# Patient Record
Sex: Female | Born: 1950 | Race: Black or African American | Hispanic: No | Smoking: Current every day smoker
Health system: Southern US, Community
[De-identification: ages and names within clinical notes are randomized; demographics above are authoritative.]

## PROBLEM LIST (undated history)

## (undated) DIAGNOSIS — R42 Dizziness and giddiness: Secondary | ICD-10-CM

## (undated) DIAGNOSIS — R55 Syncope and collapse: Secondary | ICD-10-CM

## (undated) DIAGNOSIS — Z5189 Encounter for other specified aftercare: Secondary | ICD-10-CM

## (undated) DIAGNOSIS — M1990 Unspecified osteoarthritis, unspecified site: Secondary | ICD-10-CM

## (undated) DIAGNOSIS — M419 Scoliosis, unspecified: Secondary | ICD-10-CM

## (undated) DIAGNOSIS — D649 Anemia, unspecified: Secondary | ICD-10-CM

## (undated) HISTORY — DX: Unspecified osteoarthritis, unspecified site: M19.90

## (undated) HISTORY — DX: Scoliosis, unspecified: M41.9

## (undated) HISTORY — DX: Dizziness and giddiness: R42

## (undated) HISTORY — DX: Anemia, unspecified: D64.9

## (undated) HISTORY — PX: ABDOMINAL HYSTERECTOMY: SHX81

## (undated) HISTORY — DX: Encounter for other specified aftercare: Z51.89

## (undated) HISTORY — PX: TONSILLECTOMY: SUR1361

---

## 1997-07-26 ENCOUNTER — Inpatient Hospital Stay (HOSPITAL_COMMUNITY): Admission: AD | Admit: 1997-07-26 | Discharge: 1997-07-28 | Payer: Self-pay | Admitting: Obstetrics & Gynecology

## 1999-12-30 ENCOUNTER — Encounter: Admission: RE | Admit: 1999-12-30 | Discharge: 1999-12-30 | Payer: Self-pay | Admitting: Family Medicine

## 2000-02-01 ENCOUNTER — Encounter: Admission: RE | Admit: 2000-02-01 | Discharge: 2000-02-01 | Payer: Self-pay | Admitting: Family Medicine

## 2000-03-17 ENCOUNTER — Encounter: Admission: RE | Admit: 2000-03-17 | Discharge: 2000-03-17 | Payer: Self-pay | Admitting: Sports Medicine

## 2005-12-07 ENCOUNTER — Other Ambulatory Visit: Admission: RE | Admit: 2005-12-07 | Discharge: 2005-12-07 | Payer: Self-pay | Admitting: *Deleted

## 2005-12-17 ENCOUNTER — Encounter: Admission: RE | Admit: 2005-12-17 | Discharge: 2005-12-17 | Payer: Self-pay | Admitting: *Deleted

## 2006-02-01 ENCOUNTER — Inpatient Hospital Stay (HOSPITAL_COMMUNITY): Admission: RE | Admit: 2006-02-01 | Discharge: 2006-02-05 | Payer: Self-pay | Admitting: Obstetrics and Gynecology

## 2006-02-01 ENCOUNTER — Encounter (INDEPENDENT_AMBULATORY_CARE_PROVIDER_SITE_OTHER): Payer: Self-pay | Admitting: *Deleted

## 2006-04-27 ENCOUNTER — Encounter: Admission: RE | Admit: 2006-04-27 | Discharge: 2006-04-27 | Payer: Self-pay | Admitting: Otolaryngology

## 2007-01-12 ENCOUNTER — Other Ambulatory Visit: Admission: RE | Admit: 2007-01-12 | Discharge: 2007-01-12 | Payer: Self-pay | Admitting: Family Medicine

## 2008-05-30 ENCOUNTER — Inpatient Hospital Stay (HOSPITAL_COMMUNITY): Admission: EM | Admit: 2008-05-30 | Discharge: 2008-06-05 | Payer: Self-pay | Admitting: Emergency Medicine

## 2008-06-28 ENCOUNTER — Emergency Department (HOSPITAL_COMMUNITY): Admission: EM | Admit: 2008-06-28 | Discharge: 2008-06-28 | Payer: Self-pay | Admitting: Family Medicine

## 2010-07-30 LAB — POCT I-STAT, CHEM 8
BUN: 14 mg/dL (ref 6–23)
Chloride: 108 mEq/L (ref 96–112)
Creatinine, Ser: 1.1 mg/dL (ref 0.4–1.2)
HCT: 37 % (ref 36.0–46.0)
Potassium: 3.4 mEq/L — ABNORMAL LOW (ref 3.5–5.1)

## 2010-08-04 LAB — BASIC METABOLIC PANEL
BUN: 1 mg/dL — ABNORMAL LOW (ref 6–23)
CO2: 26 mEq/L (ref 19–32)
CO2: 27 mEq/L (ref 19–32)
CO2: 28 mEq/L (ref 19–32)
Calcium: 8.4 mg/dL (ref 8.4–10.5)
Chloride: 105 mEq/L (ref 96–112)
Chloride: 108 mEq/L (ref 96–112)
GFR calc Af Amer: >60 mL/min (ref >60)
GFR calc non Af Amer: >60 mL/min (ref >60)
GFR calc non Af Amer: >60 mL/min (ref >60)
Glucose, Bld: 121 mg/dL — ABNORMAL HIGH (ref 70–99)
Glucose, Bld: 106 mg/dL — ABNORMAL HIGH (ref 70–99)
Potassium: 3.9 mEq/L (ref 3.5–5.1)
Potassium: 4.6 mEq/L (ref 3.5–5.1)
Sodium: 135 mEq/L (ref 135–145)
Sodium: 138 mEq/L (ref 135–145)

## 2010-08-04 LAB — RAPID URINE DRUG SCREEN, HOSP PERFORMED
Amphetamines: NOT DETECTED
Barbiturates: NOT DETECTED
Benzodiazepines: NOT DETECTED

## 2010-08-04 LAB — CBC
HCT: 28.3 % — ABNORMAL LOW (ref 36.0–46.0)
Hemoglobin: 10 g/dL — ABNORMAL LOW (ref 12.0–15.0)
Hemoglobin: 12.4 g/dL (ref 12.0–15.0)
Hemoglobin: 9 g/dL — ABNORMAL LOW (ref 12.0–15.0)
MCHC: 32 g/dL (ref 30.0–36.0)
MCV: 70.6 fL — ABNORMAL LOW (ref 78.0–100.0)
Platelets: 398 10*3/uL (ref 150–400)
RBC: 4.04 MIL/uL (ref 3.87–5.11)
RBC: 4.48 MIL/uL (ref 3.87–5.11)
RDW: 19.1 % — ABNORMAL HIGH (ref 11.5–15.5)
RDW: 19.8 % — ABNORMAL HIGH (ref 11.5–15.5)
RDW: 20 % — ABNORMAL HIGH (ref 11.5–15.5)
WBC: 11.4 10*3/uL — ABNORMAL HIGH (ref 4.0–10.5)

## 2010-08-04 LAB — COMPREHENSIVE METABOLIC PANEL
AST: 11 U/L (ref 0–37)
AST: 17 U/L (ref 0–37)
Albumin: 4.5 g/dL (ref 3.5–5.2)
CO2: 22 mEq/L (ref 19–32)
Calcium: 8.7 mg/dL (ref 8.4–10.5)
Chloride: 109 mEq/L (ref 96–112)
Creatinine, Ser: 0.78 mg/dL (ref 0.4–1.2)
GFR calc Af Amer: >60 mL/min (ref >60)
GFR calc non Af Amer: >60 mL/min (ref >60)
GFR calc non Af Amer: >60 mL/min (ref >60)
Potassium: 3.7 mEq/L (ref 3.5–5.1)
Total Bilirubin: 0.9 mg/dL (ref 0.3–1.2)
Total Bilirubin: 0.9 mg/dL (ref 0.3–1.2)

## 2010-08-04 LAB — URINALYSIS, ROUTINE W REFLEX MICROSCOPIC
Hgb urine dipstick: NEGATIVE
Protein, ur: NEGATIVE mg/dL
Specific Gravity, Urine: 1.021 (ref 1.005–1.030)
Urobilinogen, UA: 1 mg/dL (ref 0.0–1.0)

## 2010-08-04 LAB — HEMOGLOBIN A1C: Mean Plasma Glucose: 131 mg/dL

## 2010-08-04 LAB — DIFFERENTIAL
Basophils Absolute: 0 10*3/uL (ref 0.0–0.1)
Eosinophils Absolute: 0 10*3/uL (ref 0.0–0.7)
Eosinophils Relative: 0 % (ref 0–5)
Lymphocytes Relative: 18 % (ref 12–46)
Monocytes Absolute: 0.6 10*3/uL (ref 0.1–1.0)
Monocytes Relative: 1 % — ABNORMAL LOW (ref 3–12)
Monocytes Relative: 5 % (ref 3–12)
Neutro Abs: 8.6 10*3/uL — ABNORMAL HIGH (ref 1.7–7.7)
Neutrophils Relative %: 77 % (ref 43–77)

## 2010-08-04 LAB — POCT PREGNANCY, URINE: Preg Test, Ur: NEGATIVE

## 2010-08-04 LAB — LIPID PANEL
HDL: 50 mg/dL (ref >39)
LDL Cholesterol: 85 mg/dL (ref 0–99)
Total CHOL/HDL Ratio: 3 RATIO
Triglycerides: 65 mg/dL (ref <150)

## 2010-08-04 LAB — LACTIC ACID, PLASMA: Lactic Acid, Venous: 0.6 mmol/L (ref 0.5–2.2)

## 2010-08-04 LAB — LIPASE, BLOOD: Lipase: 17 U/L (ref 11–59)

## 2010-09-01 NOTE — Discharge Summary (Signed)
NAME:  Makayla Duran, Makayla Duran NO.:  [id number]   MEDICAL RECORD NO.:  [id number]          PATIENT TYPE:  INP   LOCATION:  1301                         FACILITY:  [facility]   PHYSICIAN:  Charlestine Massed, MDDATE OF BIRTH:  08/07/1950   DATE OF ADMISSION:  05/30/2008  DATE OF DISCHARGE:                               DISCHARGE SUMMARY   PRIMARY CARE DOCTOR:  She does not have a primary care doctor so far,  and she is planning to set it up after she gets discharged because of  recent health plan changes.   SURGEON:  Dr. Ezzard Standing of [facility].   REASON FOR ADMISSION:  Abdominal pain and vomiting.   HOSPITAL COURSE:  A 60 year old female, [name], who has a  prior history of hysterectomy and bilateral salpingo-oophorectomy in  2007, came to the emergency room with complaint of 1-day history of  abdominal pain and vomiting.  Abdominal pain was very severe, woke her  up from sleep, after which she had intractable vomiting for more than 12  hours.  She did not have any other complaints of chest pain or loss of  consciousness.  She was feeling weak and fatigued.  No urinary  symptoms,no fever, no chills.  She never had similar symptoms before.   The patient was admitted and evaluated.  CT scan of abdomen with  contrast showed evidence of a small-bowel obstruction, so the patient  was treated, admitted for small-bowel obstruction.  There were dilated  loops of small bowel in the left lower quadrant.   Dr. Ezzard Standing from [facility] saw the patient and evaluated  her and found there is no reason for any surgical intervention.  The  patient was observed.  With IV fluids and with antiemetic and pain  medications, the patient improved considerably.  Her electrolytes were  repleted.   After the patient's improvement, the patient was slowly started on clear  liquid diet and repeat KUB showed just gaseous distention, no evidence  of fluid,  air-fluid levels.  Today the patient is being advanced to a  regular diet and if the patient tolerates it for today will be  discharged tomorrow in a.m.   DISCHARGE DIAGNOSES:  1. Small-bowel obstruction.  2. Tobacco dependence.   DISCHARGE MEDICATIONS:  1. Nicotine patch 21 mg per day for 7 days, then 14 mg per day for 7      days and then 7 mg per day for 7 days, then stop.  2. Zofran 4 mg p.o. to give 4 times a day p.r.n. for nausea, vomiting.   DISCHARGE INSTRUCTIONS:  The patient has been advised to continue taking  regular diet.  If there is any recurrence of pains, to come back to the  emergency room.   FOLLOWUP:  The patient has been advised set up a primary care doctor for  regular followup and to follow up with Dr. Ezzard Standing if needed, which will  be decided tomorrow.   A total of 50 minutes was spent on this discharge.  The patient will be  discharged possibly tomorrow if she is stable.  Charlestine Massed, MD  Electronically Signed     UT/MEDQ  D:  06/04/2008  T:  06/04/2008  Job:  [id number]   cc:   Sandria Bales. Ezzard Standing, M.D.  [address]  [place]  [place] [address]

## 2010-09-01 NOTE — Discharge Summary (Signed)
NAME:  Makayla Duran, Makayla Duran             ACCOUNT NO.:  [id number]   MEDICAL RECORD NO.:  [id number]          PATIENT TYPE:  INP   LOCATION:  1301                         FACILITY:  [facility]   PHYSICIAN:  Monte Fantasia, MD  DATE OF BIRTH:  02/17/1951   DATE OF ADMISSION:  05/30/2008  DATE OF DISCHARGE:                               DISCHARGE SUMMARY   ADDENDUM:  Please refer to the discharge summary dictated by Dr. Berkley Harvey on June 05, 2007.  There have been no interim changes in the discharge diagnoses  or the discharge medications as dictated in the prior discharge summary.  For the last 24 hours the patient has tolerated oral intake very well  and denies any complaints of pain in abdomen.  The patient also was  followed up with Dr. Ezzard Standing who recommended that the patient is stable  to be discharged and can be discharged home today.   The patient's CT scan of the chest was reviewed regarding the pulmonary  lung nodule with her and the patient is recommended to follow up with  the primary care physician in one week and to follow up with the CT scan  report and recommended to have a CT scan repeat done in 3 months.   The patient at present is medically stable to be discharged and can be  discharged home.  Recommended to follow up with the primary care  physician in one week.      Monte Fantasia, MD  Electronically Signed     MP/MEDQ  D:  06/05/2008  T:  06/05/2008  Job:  [id number]

## 2010-09-01 NOTE — H&P (Signed)
NAME:  Makayla Duran, Makayla Duran NO.:  [id number]   MEDICAL RECORD NO.:  [id number]          PATIENT TYPE:  EMS   LOCATION:  ED                           FACILITY:  [facility]   PHYSICIAN:  Oswald Hillock, MD        DATE OF BIRTH:  1950-05-10   DATE OF ADMISSION:  05/30/2008  DATE OF DISCHARGE:                              HISTORY & PHYSICAL   CHIEF COMPLAINT:  Abdominal pain/vomiting.   HISTORY OF PRESENT ILLNESS:  The patient is a 60 year old Caucasian  female with history of hysterectomy and bilateral salpingo-oophorectomy  in 2007, who presents to the emergency room with a 1 day history of  abdominal pain and vomiting.  The patient states that she was doing well  till last night and this morning she woke up with significant abdominal  pain and was vomiting incessantly to 12:30 p.m. this afternoon.  She  denies any chest pain, any shortness of breath, palpitations, dizziness,  loss of consciousness or any focal weakness of any part of the body.  No  fever, rigors, chills reported by the patient either.  The patient has  never had similar symptoms in the past and denies any urinary symptoms.   PAST MEDICAL HISTORY:  1. Significant uterine leiomyoma  2. Postmenopausal bleeding.  3. Hydrosalpinx.   PAST SURGICAL HISTORY:  Significant for abdominal hysterectomy and  bilateral salpingo-oophorectomy.   CURRENT MEDICATIONS:  None.   ALLERGIES:  NO KNOWN DRUG ALLERGIES.   FAMILY HISTORY:  No history of hypertension, diabetes mellitus in the  family.   SOCIAL HISTORY:  The patient smokes a pack per day.  Does use crack  cocaine on a regular basis.  Denies any alcohol use.   REVIEW OF SYSTEMS:  Extensive review of systems were done and all  systems are negative except for positives mentioned in the history of  present illness.   PHYSICAL EXAMINATION:  VITAL SIGNS on admission:  Pulse 63, blood  pressure 140/66, respiratory rate of 20, temperature 98.3.  GENERAL:  The patient  is conscious, alert, oriented to time, place and  person in  mild-moderate distress complaining of significant abdominal discomfort.  HEENT:  No scleral icterus.  No pallor.  Ears negative.  Poor dental  hygiene.  NECK:  Supple.  No lymphadenopathy.  No JVD.  No carotid bruits.  CHEST:  Breath sounds heard bilaterally, bilateral rhonchi.  CVS:  S1 and S2 plus regular.  No gallop, rub or murmur appreciated.  ABDOMEN:  Soft.  Diffuse tenderness.  However, no guarding or rebound.  Bowel sounds are present in all 4 quadrants.  EXTREMITIES:  No cyanosis, clubbing or edema.  NEUROLOGIC:  Cranial nerves II-XII appear grossly intact.  The patient  moves all 4 extremities.   DIAGNOSTICS:  CT scan of the abdomen revealed small bowel obstruction,  some mesenteric edema and ascites, but no focal extraluminal fluid  collection and 6 mm left lower lobe pulmonary nodule.   LABORATORY DATA:  WBC count was 11.4, hemoglobin 12.4, hematocrit 38.9,  platelet count 398, lipase was 17.  CMP showed sodium of 136, potassium  3.6, chloride 104, CO2 22, glucose 132, BUN 7, creatinine 0.78, calcium  of 9.8.  Urine drug screen was positive for cocaine.  Urinalysis was  negative and urine pregnancy test was negative as well.   IMPRESSION/PLAN:  This is a case of a 60 year old African American  female who presents with a 1 day history of abdominal pain and vomiting.  1. Small bowel obstruction.  We will admit the patient to the Medicine      Service, put an NG tube to low volume suction, use Zofran and      Phenergan on a p.r.n. basis for vomiting.  We will hydrate the      patient with intravenous fluids normal saline at 150 mL an hour.      Surgery has been consulted by the ER physician and they will assess      the patient in the morning.  Etiology of this is unclear.  The      patient has only had a single abdominal hysterectomy and there are      no reports of any adhesions on the CT scan.  We will get a  lactic      acid level and follow up with the results.  2. Left lung nodule.  Given her significant history of smoking, the      suspicion for malignancy is high.  She may need either outpatient      evaluation or if she stays inpatient for a few days, we may consult      Pulmonary Medicine and follow up with their recommendations.  3. Cocaine abuse.  The patient was counseled about the need for      cessation and should follow up with her primary care physician.  4. DVT/GI prophylaxis.  Heparin and Protonix.      Oswald Hillock, MD  Electronically Signed     BA/MEDQ  D:  05/30/2008  T:  05/31/2008  Job:  [id number]   cc:   PCP

## 2010-09-01 NOTE — Consult Note (Signed)
NAME:  Makayla Duran, VARANO NO.:  [id number]   MEDICAL RECORD NO.:  [id number]          PATIENT TYPE:  INP   LOCATION:  1301                         FACILITY:  [facility]   PHYSICIAN:  Sandria Bales. Ezzard Standing, M.D.  DATE OF BIRTH:  [birth year]   DATE OF CONSULTATION:  05/31/2008  DATE OF DISCHARGE:                                 CONSULTATION   Date of surgery ?   REFERRING PHYSICIAN:  Monte Fantasia, M.D.   REASON FOR CONSULTATION:  Bowel obstruction.   HISTORY OF PRESENT ILLNESS:  This is a 60 year old black female who was  seeing Dr. Rosey Bath of [place] at [facility] (as I understand it,  Dr. Vedia Coffer has left town) and Ms. Laramee is looking for a new primary  care physician.  She awoke yesterday morning about 3 a.m. with severe  abdominal pain which was not well localized.  She had nausea and  vomiting.  She tried to have a couple of bowel movements which were  painful.  She presented to the [facility] Emergency Room where she  underwent a CT scan which showed evidence of a bowel obstruction and  [facility] was consulted.  She was admitted by [facility] and Dr.  Kathryne Hitch asked me to see her today for further evaluation.   Ms. Mccallum has had no prior gastrointestinal problems.  She has had no  peptic ulcer disease, liver disease, gallbladder disease, pancreatic  disease or colon disease.  She has had 3 gynecologic procedures, the  first about 1980, she had some fibroids removed from her uterus in  [place], then in 1999 she had a right ovary removed for an infection by  Dr. Donzetta Sprung, and then February 01, 2006 she had a hysterectomy and left  salpingo-oophorectomy by Dr. Loreli Dollar for hydrosalpinx and  postmenopausal bleeding.   She has had no prior complaints of either bowel obstruction or change in  bowel habits, and she noticed no change in her stools prior to this  event.   PAST MEDICAL HISTORY:  She has no allergies.  She is on no medications.   REVIEW OF SYSTEMS:  NEUROLOGIC:  She has had no seizure or loss of  consciousness.  PULMONARY:  She does smoke about a half a pack of cigarettes per day,  she knows this is bad for her health.  CARDIAC:  She has had no heart disease, chest pain or hypertension, and  she has never had a prior cardiac catheterization.  GASTROINTESTINAL:  See history of present illness.  UROLOGIC:  Ms. Ressler has had no kidney infections.   SOCIAL HISTORY:  She is separated, she lives by herself, she has a  brother who lives in town, a sister who lives in [place].  She has never  had any children.  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   PHYSICAL EXAM:  She is a thinner black female who is alert and  cooperative though miserable with the NG tube.  Her temperature is 98.6, her pulse is 66, her respirations are 18, blood  pressure  133/80.   HEENT:  Normocephalic, no oral lesions.  She has an NGT in her right  nares which is very uncomfortable.  NECK:  Normal thyroid without mass  LYMPH NODES:  No cervical or supraclavicular nodes  LUNGS:  Clear to auscultation and symmetric  HEART:  Regular rate and rythm, no murmur  ABDOMEN:  Soft, has bowel sounds, has well healed Pfannenstiel incision,  no hernia or mass  EXTREMITIES:  Good strength in upper and lower extremities  NEUROLOGIC:  Intact to motor and sensory exam   Her hemoglobin is 10, hematocrit 31, white blood count of 11,200.  She  has 77% neutrophils, her sodium is 140, potassium 4.1, chloride of 109,  CO2 of 24, glucose of 110.  Her creatinine is 0.79.  Her liver functions  are within normal limits.  Her lipid panel showed a cholesterol of 148  and she had a lipase on admission which was normal and a lactic acid  which was also normal.   I reviewed her CT scan which does show an apparent high-grade  obstruction at the mid small bowel.  She also has a small nodule on her  left lower  lobe in which there was a recommended 48-month followup with  low clinical suspicion or a PET scan with high clinical suspicion.  Since she is a smoker, she ought to consider a PET scan, and I discussed  this with her.   IMPRESSION:  1. Small bowel obstruction, probably secondary to adhesions:  I agree      with plan for NG tube and IV fluids.  Will plan KUB.  Hopefully      this will resolve without surgery, but I told her if she was      clearly no better in 2-3 days, she  could require either      laparoscopic or open laparotomy for evaluation of this bowel      obstruction.  The patient understands this pretty well.  She is      having a lot of trouble tolerating the NGT.  We will try throat      sprays, lozanges and ice to try to help her discomfort.  2. Smokes cigarettes.  3. Abnormal computed tomographic scan nodule of left chest:  This will      need to be worked up once this bowel obstruction has cleared up.      Discussed with patient.      Sandria Bales. Ezzard Standing, M.D.  Electronically Signed     DHN/MEDQ  D:  05/31/2008  T:  05/31/2008  Job:  [id number]   cc:   Monte Fantasia, MD   Bing Neighbors. Sydnee Cabal, MD  Fax: [phone]   Carma Leaven, DO  Fax: [phone]

## 2010-09-04 NOTE — Discharge Summary (Signed)
NAME:  Makayla Duran, Makayla Duran             ACCOUNT NO.:  [id number]   MEDICAL RECORD NO.:  [id number]          PATIENT TYPE:  INP   LOCATION:  9316                          FACILITY:  [facility]   PHYSICIAN:  Charles A. Delcambre, MDDATE OF BIRTH:  May 27, 1950   DATE OF ADMISSION:  02/01/2006  DATE OF DISCHARGE:  02/05/2006                                 DISCHARGE SUMMARY   PRIMARY DISCHARGE DIAGNOSES:  1. CIN-2.  2. Hydrosalpinx.  3. Uterine leiomyoma.  4. Post menopausal bleeding.   PROCEDURE:  Transabdominal hysterectomy, left salpingo-oophorectomy  (previous right salpingo-oophorectomy).   DISPOSITION:  The patient discharged home.  The patient to return to the  office in two weeks.  She was given convalescent instructions to rest at  home. No heavy lifting greater than 25 pounds for two weeks. No bath for two  weeks.  Shower okay for two weeks. No driving for two weeks.  Call for any  temperature over 100 degrees, any erythema or drainage from the wound.  Any  significant vaginal bleeding, increased pain, nausea or vomiting of  significance.  She was given prescription of Vicodin 1-2 p.o. q. 4 hours #40  and Motrin 600 mg one p.o. q. 6 hours p.r.n. #30, two refills. She began  taking iron in about 1-2 weeks as tolerated. She has a prescription of iron  tablet, I do not recall which one from the office, and will take this once a  day.   History and physical is dictated on the chart.   LABORATORY DATA:  Postoperative hematocrit 27.4, hemoglobin 8.9.  Pathology  did return with diagnosis as noted above from the uterus and left ovary and  tube.   HOSPITAL COURSE:  The patient was admitted and underwent surgery as noted  above.  Postoperatively, the patient had routine postop course on postop day  one with good pain control with PCA.  The PCA was discontinued. She was  placed on Percocet and she did have some nausea on evening of postop day one  and vomited some on the evening of postop  day one. She voided without  difficulty on postop day one once the catheter was discontinued. She had  hypoactive bowel sounds on postop day two and was mildly distended with gas,  consistent with gas, otherwise was stable. She elected to get a suppository  and Dulcolax was given.  She had good response with bowel movement and  passage of gas.   On postop day three, she continued to have some nausea and vomiting, this  was questioned whether it was due to the pain medication, Percocet.  She was  observed further through the day and with continued gas pain and some  nausea. She was observed for one more night in the hospital. On postop day  four, she had good flatus, good continued bowel  movements. Nausea had abaded but still some with Percocet. It was elected to  change to Vicodin. She was observed with Vicodin and tolerated this well and  was discharged home with follow-up as noted above.  Staples were  discontinued prior to discharge as she was  on postop day four and Steri-  Strips were applied.      Charles A. Sydnee Cabal, MD  Electronically Signed     CAD/MEDQ  D:  02/05/2006  T:  02/06/2006  Job:  [id number]

## 2010-09-04 NOTE — Op Note (Signed)
NAME:  Makayla Duran, Makayla Duran             ACCOUNT NO.:  [id number]   MEDICAL RECORD NO.:  [id number]          PATIENT TYPE:  AMB   LOCATION:  SDC                           FACILITY:  [facility]   PHYSICIAN:  Charles A. Delcambre, MDDATE OF BIRTH:  [birth year]   DATE OF PROCEDURE:  02/01/2006  DATE OF DISCHARGE:                                 OPERATIVE REPORT   PREOPERATIVE DIAGNOSIS:  1. Left adnexal probable hydrosalpinx, left adnexal mass.  2. Post menopausal bleeding.  3. Cervical stenosis.  4. Left ovarian cyst.   POSTOPERATIVE DIAGNOSIS:  1. Left adnexal probable hydrosalpinx, left adnexal mass.  2. Post menopausal bleeding.  3. Cervical stenosis.  4. Left ovarian cyst.   PROCEDURE:  Transabdominal hysterectomy, left salpingo-oophorectomy.   SURGEON:  Charles A. Sydnee Cabal, MD   ASSISTANT:  Gerald Leitz, M.D.   COMPLICATIONS:  None.   ESTIMATED BLOOD LOSS:  Less than 50 mL.   SPECIMENS:  Uterus, left tube and ovary to pathology.   ANESTHESIA:  General by the endotracheal route.   COUNTS:  Instrument, sponge, and needle counts were correct x 2.   OPERATIVE FINDINGS:  The left tube was restrictive consistent with likely  hydrosalpinx with questionable thrombosis, discolored area, normal uterus  externally grossly, right ovary and tube surgically absent.   DESCRIPTION OF PROCEDURE:  The patient was taken to the operating room and  placed in the supine position.  General endotracheal anesthesia was induced  without difficulty.  She was then sterilely prepped and draped and a  Pfannenstiel incision was made with the knife and carried down to the  fascia.  The fascia was incised with the knife and Mayo scissors.  The  rectus sheath was released superiorly and inferiorly sharply.  The rectus  muscles were sharply dissected in the midline.  The peritoneum was entered  with Metzenbaum scissors without damage to bowel, bladder, or vascular  structures.  The Balfour retractor was placed  and moistened laps x2 were  used to pack the bowel out of the pelvis.  Kelly clamps were placed on the  uterine cornua regions.  The round ligament was opened on either side  opening the broad ligament on the left and exposing a uterine vessel on the  right.  The vesicoperitoneum was taken down anteriorly along the lower  uterine segment.  The ureter was well seen on the left and separated from  the infundibulopelvic pedicle.  This pedicle was taken, free tied, and  transfixion stitched with 0 Vicryl.  Hemostasis was excellent.  The bladder  flap was taken down with sharp dissection with the Metzenbaum scissors and  this allowed skeletonization of the uterine vessels on either side.  These  were simple stitched.  The pedicles were taken on either side.  The  remainder of the cardinal ligaments and, finally, uterosacral ligaments were  taken on either side, transfixion stitched, and held.  This allowed this  pedicle to enter the vaginal cornua at the vaginal angles and Satinsky  scissors were used to excise the cervix from the vaginal cuff.  Richardson  angled sutures of 0 Vicryl were  placed on either vaginal angle.  0 Vicryl  was used in a running locking suture to close the vaginal cuff.  Hemostasis  was excellent, irrigation was carried out, hemostasis of all pedicles and  the vaginal cuff and the bladder were excellent and all instruments were  removed, laps were removed, Balfour retractor was removed.  Subfascial  hemostasis was excellent.  The fascia was closed with #1 Vicryl running  nonlocking suture.  Subcutaneous hemostasis was excellent.  Irrigation was  carried out.  The skin was closed with sterile skin staples.  The patient  tolerated the procedure well and was taken to the recovery room with the  physician in attendance.      Charles A. Sydnee Cabal, MD  Electronically Signed     CAD/MEDQ  D:  02/01/2006  T:  02/02/2006  Job:  [id number]

## 2010-09-04 NOTE — H&P (Signed)
NAME:  Makayla, Duran             ACCOUNT NO.:  [id number]   MEDICAL RECORD NO.:  [id number]          PATIENT TYPE:  AMB   LOCATION:                                FACILITY:  [facility]   PHYSICIAN:  Charles A. Delcambre, MDDATE OF BIRTH:  10-04-1950   DATE OF ADMISSION:  DATE OF DISCHARGE:                                HISTORY & PHYSICAL   A 60 year old, para 0, 0, 1, 0, to be admitted on February 01, 2006, to  undergo transabdominal hysterectomy, left salpingo-oophorectomy for a left  adnexal mass and postmenopausal versus perimenopause bleeding, unresponsive  to conservative management with hormonal management, failing and cervical  stenosis precluding endometrial biopsy.  She had been amenorrheic for 11  months without hot flashes but then developed abnormal uterine bleeding and,  of recently, has complained of abnormal bleeding despite being felt to be  menopausal.  She has had to use 2 pads per day but then recently had to  change a pad every 1 to 2 hours with severe cramping, only controlled with  Motrin 800 mg every 6 hours.  She had an ultrasound which showed a uterine  fibroid posteriorly 1.7 x 1.6 x 1.1 cm, endometrium tri-layered without mass  measuring 0.4 cm, not suspicious.  For this reason, she will not be taken  for D and C with cervical stenosis precluding endometrial biopsy prior to  definitive surgery.  Right ovary surgically absent status post RSO for  benign reasons in the past.  Left ovary does show 4.1 x 2.3 x 3.2 cm simple  cyst, in addition 2.2 x 1.9 x 1.6 cm cyst appearing avascular.  There is a  left adnexal cystic tubular structure 6.8 x 4.8 x 2.3 cm, questionably a  hydrosalpinx.  The patient is very anxious and wishes to undergo definitive  procedure with a hysterectomy and left oophorectomy and a resection of the  left adnexal process.  Secondary to the bleeding and adnexal abnormality,  refusing at this time hormonal management of abnormal bleeding.   PAST  MEDICAL HISTORY:  1. Depression.  2. Insomnia.   SURGICAL HISTORY:  Ovarian cyst resection, myomectomy, tonsils and adenoids,  elective abortion.   MEDICATIONS:  1. Effexor 75 mg, weaning 1 every other day.  2. Excedrin p.r.n.  3. Tylenol p.r.n.  4. Motrin p.r.n.  5. Zolpidem 10 mg p.r.n.  6. Iron sulfate b.i.d. (hematocrit 36, recently with Dr. Vedia Coffer at [facility]).   ALLERGIES:  Adverse reaction to STEROIDS,  non specified.   SOCIAL HISTORY:  Half pack per day cigarette smoking and 1 beer per day.  No  drug use or STD exposure in the past.  She is married in a monogamous  relationship with her husband.   FAMILY HISTORY:  Father deceased at 50, hypertension, heart disease.  Mother  deceased 55, bone cancer, hypertension,  heart disease.  Brother and sister  23 and 73, respectively, in good health.  Denies family history of breast,  uterus, ovaries, cervix, colon cancer, lipoma, strokes, and diabetes.   REVIEW OF SYMPTOMS:  Denies fever, chills, rashes,  lesions, headaches,  dizziness.  She does have some seasonal allergies.  No chest pain, shortness  of breath, or wheezing.  No diarrhea, constipation, bleeding, melena, or  hematochezia.  No urgency, frequency, dysuria, incontinence, hematuria,  galactorrhea, or emotional changes.   PHYSICAL EXAMINATION:  Alert and oriented x3.  No distress.  Blood pressure 118/80.  Respirations 18.  Heart rate 64.  Weight 150 pounds.  HEENT:  Grossly within normal limits.  NECK:  Supple without thyromegaly or adenopathy.  LUNGS: Clear bilaterally.  HEART:  Regular rate and rhythm without murmur, rub, or gallop.  BREAST:  Symmetrical otherwise deferred.  ABDOMEN:  Soft, flat, and nontender without hepatosplenomegaly or abdominal  masses noted.  Pfannenstiel's incision scar is noted.  PELVIC:  Normal external female genitalia.  Bartholin's, urethra, Skene's  within normal limits.  Vault without discharge or lesions.  No cervical   motion tenderness is present.  Bimanual examination, uterus non-enlarged.  Adnexa on the left palpates to be normal in contradistinction to ovary  findings on ultrasound.  I cannot detect a mass in this area.  RECTAL:  Not done.  Anus and perineal body appear normal.   ASSESSMENT:  1. Abnormal uterine bleeding, menopausal versus perimenopausal.  2. Left ovarian cyst.  3. 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  4. Cervical stenosis.   PLAN:  The patient will be admitted to undergo transabdominal hysterectomy,  left salpingo-oophorectomy.  She will remain NPO past midnight the evening  prior to surgery, scheduled for February 01, 2006.  Preoperative CBC will be  drawn  preoperatively with a BM ET and type and screen.  SCDs will be used  perioperatively.  As it is felt to be a benign process, we will not have the  patient undergo a bowel prep.  She gives an informed consent Accepts risks  of infection, bleeding, bowel and bladder damage, ureteral damage, blood  product risk including hepatitis, and HIV exposure.  All questions were  answered and we will proceed as outlined.      Charles A. Sydnee Cabal, MD  Electronically Signed     CAD/MEDQ  D:  01/17/2006  T:  01/17/2006  Job:  [id number]

## 2011-09-14 ENCOUNTER — Emergency Department (HOSPITAL_COMMUNITY)
Admission: EM | Admit: 2011-09-14 | Discharge: 2011-09-14 | Disposition: A | Discharge status: Home / Self Care | Payer: Self-pay | Attending: Emergency Medicine | Admitting: Emergency Medicine

## 2011-09-14 ENCOUNTER — Encounter (HOSPITAL_COMMUNITY): Payer: Self-pay

## 2011-09-14 DIAGNOSIS — F17200 Nicotine dependence, unspecified, uncomplicated: Secondary | ICD-10-CM | POA: Insufficient documentation

## 2011-09-14 DIAGNOSIS — R197 Diarrhea, unspecified: Secondary | ICD-10-CM | POA: Insufficient documentation

## 2011-09-14 DIAGNOSIS — R42 Dizziness and giddiness: Secondary | ICD-10-CM | POA: Insufficient documentation

## 2011-09-14 DIAGNOSIS — R61 Generalized hyperhidrosis: Secondary | ICD-10-CM | POA: Insufficient documentation

## 2011-09-14 DIAGNOSIS — R112 Nausea with vomiting, unspecified: Secondary | ICD-10-CM | POA: Insufficient documentation

## 2011-09-14 LAB — DIFFERENTIAL
Basophils Absolute: 0 10*3/uL (ref 0.0–0.1)
Basophils Relative: 0 % (ref 0–1)
Eosinophils Absolute: 0 10*3/uL (ref 0.0–0.7)
Eosinophils Relative: 0 % (ref 0–5)
Monocytes Absolute: 0.4 10*3/uL (ref 0.1–1.0)
Monocytes Relative: 3 % (ref 3–12)

## 2011-09-14 LAB — URINALYSIS, ROUTINE W REFLEX MICROSCOPIC
Glucose, UA: NEGATIVE mg/dL
Ketones, ur: NEGATIVE mg/dL
Leukocytes, UA: NEGATIVE
Nitrite: NEGATIVE
Protein, ur: NEGATIVE mg/dL
pH: 6 (ref 5.0–8.0)

## 2011-09-14 LAB — COMPREHENSIVE METABOLIC PANEL
AST: 38 U/L — ABNORMAL HIGH (ref 0–37)
Albumin: 3.2 g/dL — ABNORMAL LOW (ref 3.5–5.2)
BUN: 9 mg/dL (ref 6–23)
Calcium: 8.6 mg/dL (ref 8.4–10.5)
Creatinine, Ser: 0.64 mg/dL (ref 0.50–1.10)

## 2011-09-14 LAB — CBC
HCT: 33.5 % — ABNORMAL LOW (ref 36.0–46.0)
MCH: 22.8 pg — ABNORMAL LOW (ref 26.0–34.0)
MCHC: 32.2 g/dL (ref 30.0–36.0)
MCV: 70.7 fL — ABNORMAL LOW (ref 78.0–100.0)
RDW: 20.5 % — ABNORMAL HIGH (ref 11.5–15.5)

## 2011-09-14 LAB — LIPASE, BLOOD: Lipase: 18 U/L (ref 11–59)

## 2011-09-14 MED ORDER — DIPHENOXYLATE-ATROPINE 2.5-0.025 MG PO TABS: 1.0000 | ORAL_TABLET | Freq: Four times a day (QID) | ORAL | Status: AC | PRN

## 2011-09-14 MED ORDER — ONDANSETRON HCL 4 MG/2ML IJ SOLN
INTRAMUSCULAR | Status: AC
  Administered 2011-09-14: 16:00:00
  Filled 2011-09-14: qty 2

## 2011-09-14 MED ORDER — PROMETHAZINE HCL 25 MG PO TABS: 25.0000 mg | ORAL_TABLET | Freq: Four times a day (QID) | ORAL | Status: DC | PRN

## 2011-09-14 MED ORDER — LOPERAMIDE HCL 2 MG PO CAPS
2.0000 mg | ORAL_CAPSULE | ORAL | Status: DC | PRN
  Filled 2011-09-14: qty 1

## 2011-09-14 MED ORDER — ONDANSETRON HCL 4 MG/2ML IJ SOLN
4.0000 mg | Freq: Once | INTRAMUSCULAR | Status: DC
  Filled 2011-09-14: qty 2

## 2011-09-14 MED ORDER — SODIUM CHLORIDE 0.9 % IV BOLUS (SEPSIS)
1000.0000 mL | Freq: Once | INTRAVENOUS | Status: AC
  Administered 2011-09-14: 1000 mL via INTRAVENOUS

## 2011-09-14 MED ORDER — SODIUM CHLORIDE 0.9 % IV SOLN
Freq: Once | INTRAVENOUS | Status: AC
  Administered 2011-09-14: 125 mL via INTRAVENOUS

## 2011-09-14 NOTE — ED Notes (Addendum)
Pt called ems for N/V/D and dizziness, sudden onset at 1400 today, ate lunch at 1200

## 2011-09-14 NOTE — ED Provider Notes (Signed)
History     CSN: [id number]  Arrival date & time 09/14/11  1529   First MD Initiated Contact with Patient 09/14/11 1633      Chief Complaint  Patient presents with  . Nausea  . Emesis  . Diarrhea  . Dizziness    (Consider location/radiation/quality/duration/timing/severity/associated sxs/prior treatment) Patient is a 61 y.o. female presenting with vomiting and diarrhea. The history is provided by the patient.  Emesis  Associated symptoms include diarrhea.  Diarrhea The primary symptoms include vomiting and diarrhea.   patient with acute onset of nausea vomiting diarrhea approximately 2 hours after eating lunch. Some abdominal cramping. Vomiting has been nonbilious. Diarrhea has been watery. No fever reported however she does note some diaphoresis. Denies any chest pain or shortness of breath. No medications taken prior to arrival. Nothing makes her symptoms better or worse  History reviewed. No pertinent past medical history.  Past Surgical History  Procedure Date  . Abdominal hysterectomy   . Tonsillectomy     No family history on file.  History  Substance Use Topics  . Smoking status: Current Everyday Smoker  . Smokeless tobacco: Not on file  . Alcohol Use: Yes     occasionally    OB History    Grav Para Term Preterm Abortions TAB SAB Ect Mult Living                  Review of Systems  Gastrointestinal: Positive for vomiting and diarrhea.  All other systems reviewed and are negative.    Allergies  Other  Home Medications   Current Outpatient Rx  Name Route Sig Dispense Refill  . ASCORBIC ACID 250 MG PO CHEW Oral Chew 500 mg by mouth daily.    Marland Kitchen FERROUS GLUCONATE 324 (38 FE) MG PO TABS Oral Take 324 mg by mouth daily with breakfast. For anemia. Patient states that she takes up to 6 tablets by mouth three times a day      BP 117/68  Pulse 57  Temp(Src) 97.9 F (36.6 C) (Oral)  Resp 20  SpO2 100%  Physical Exam  Nursing note and vitals  reviewed. Constitutional: She is oriented to person, place, and time. She appears well-developed and well-nourished.  Non-toxic appearance. No distress.  HENT:  Head: Normocephalic and atraumatic.  Eyes: Conjunctivae, EOM and lids are normal. Pupils are equal, round, and reactive to light.  Neck: Normal range of motion. Neck supple. No tracheal deviation present. No mass present.  Cardiovascular: Normal rate, regular rhythm and normal heart sounds.  Exam reveals no gallop.   No murmur heard. Pulmonary/Chest: Effort normal and breath sounds normal. No stridor. No respiratory distress. She has no decreased breath sounds. She has no wheezes. She has no rhonchi. She has no rales.  Abdominal: Soft. Normal appearance and bowel sounds are normal. She exhibits no distension. There is no tenderness. There is no rebound and no CVA tenderness.  Musculoskeletal: Normal range of motion. She exhibits no edema and no tenderness.  Neurological: She is alert and oriented to person, place, and time. She has normal strength. No cranial nerve deficit or sensory deficit. GCS eye subscore is 4. GCS verbal subscore is 5. GCS motor subscore is 6.  Skin: Skin is warm and dry. No abrasion and no rash noted.  Psychiatric: She has a normal mood and affect. Her speech is normal and behavior is normal.    ED Course  Procedures (including critical care time)   Labs Reviewed  CBC  DIFFERENTIAL  COMPREHENSIVE METABOLIC PANEL  LIPASE, BLOOD  URINALYSIS, ROUTINE W REFLEX MICROSCOPIC  URINE CULTURE   No results found.   No diagnosis found.    MDM    Patient IV fluids and medication for vomiting and diarrhea. She was reassessed and is eating a [place] sounds at this time and distal better. Repeat abdominal exam is benign. She is stable for discharge suspect that patient has food poisioning       Toy Baker, MD 09/14/11 [phone]

## 2011-09-14 NOTE — ED Notes (Signed)
[id number]<QI> Expected date:09/14/11<BR> Expected time:<BR> Means of arrival:<BR> Comments:<BR> EMS 40 GC - n/v/dizziness

## 2011-09-14 NOTE — Discharge Instructions (Signed)
Nausea and Vomiting  Nausea is a sick feeling that often comes before throwing up (vomiting). Vomiting is a reflex where stomach contents come out of your mouth. Vomiting can cause severe loss of body fluids (dehydration). Children and elderly adults can become dehydrated quickly, especially if they also have diarrhea. Nausea and vomiting are symptoms of a condition or disease. It is important to find the cause of your symptoms.  CAUSES    Direct irritation of the stomach lining. This irritation can result from increased acid production (gastroesophageal reflux disease), infection, food poisoning, taking certain medicines (such as nonsteroidal anti-inflammatory drugs), alcohol use, or tobacco use.   Signals from the brain.These signals could be caused by a headache, heat exposure, an inner ear disturbance, increased pressure in the brain from injury, infection, a tumor, or a concussion, pain, emotional stimulus, or metabolic problems.   An obstruction in the gastrointestinal tract (bowel obstruction).   Illnesses such as diabetes, hepatitis, gallbladder problems, appendicitis, kidney problems, cancer, sepsis, atypical symptoms of a heart attack, or eating disorders.   Medical treatments such as chemotherapy and radiation.   Receiving medicine that makes you sleep (general anesthetic) during surgery.  DIAGNOSIS  Your caregiver may ask for tests to be done if the problems do not improve after a few days. Tests may also be done if symptoms are severe or if the reason for the nausea and vomiting is not clear. Tests may include:   Urine tests.   Blood tests.   Stool tests.   Cultures (to look for evidence of infection).   X-rays or other imaging studies.  Test results can help your caregiver make decisions about treatment or the need for additional tests.  TREATMENT  You need to stay well hydrated. Drink frequently but in small amounts.You may wish to drink water, sports drinks, clear broth, or eat frozen  ice pops or gelatin dessert to help stay hydrated.When you eat, eating slowly may help prevent nausea.There are also some antinausea medicines that may help prevent nausea.  HOME CARE INSTRUCTIONS    Take all medicine as directed by your caregiver.   If you do not have an appetite, do not force yourself to eat. However, you must continue to drink fluids.   If you have an appetite, eat a normal diet unless your caregiver tells you differently.   Eat a variety of complex carbohydrates (rice, wheat, potatoes, bread), lean meats, yogurt, fruits, and vegetables.   Avoid high-fat foods because they are more difficult to digest.   Drink enough water and fluids to keep your urine clear or pale yellow.   If you are dehydrated, ask your caregiver for specific rehydration instructions. Signs of dehydration may include:   Severe thirst.   Dry lips and mouth.   Dizziness.   Dark urine.   Decreasing urine frequency and amount.   Confusion.   Rapid breathing or pulse.  SEEK IMMEDIATE MEDICAL CARE IF:    You have blood or brown flecks (like coffee grounds) in your vomit.   You have black or bloody stools.   You have a severe headache or stiff neck.   You are confused.   You have severe abdominal pain.   You have chest pain or trouble breathing.   You do not urinate at least once every 8 hours.   You develop cold or clammy skin.   You continue to vomit for longer than 24 to 48 hours.   You have a fever.  MAKE SURE YOU:      Understand these instructions.   Will watch your condition.   Will get help right away if you are not doing well or get worse.  Document Released: 04/05/2005 Document Revised: 03/25/2011 Document Reviewed: 09/02/2010  [employer] Patient Information 2012 [employer].

## 2011-09-16 LAB — URINE CULTURE
Colony Count: NO GROWTH
Culture  Setup Time: 201305290346

## 2016-04-19 DIAGNOSIS — H8109 Meniere's disease, unspecified ear: Secondary | ICD-10-CM

## 2016-04-19 HISTORY — DX: Meniere's disease, unspecified ear: H81.09

## 2016-07-08 ENCOUNTER — Encounter (HOSPITAL_COMMUNITY): Payer: Self-pay | Admitting: Emergency Medicine

## 2016-07-08 ENCOUNTER — Emergency Department (HOSPITAL_COMMUNITY): Payer: Self-pay

## 2016-07-08 ENCOUNTER — Emergency Department (HOSPITAL_COMMUNITY)
Admission: EM | Admit: 2016-07-08 | Discharge: 2016-07-08 | Disposition: A | Discharge status: Home / Self Care | Payer: Self-pay | Attending: Emergency Medicine | Admitting: Emergency Medicine

## 2016-07-08 DIAGNOSIS — Z7982 Long term (current) use of aspirin: Secondary | ICD-10-CM | POA: Insufficient documentation

## 2016-07-08 DIAGNOSIS — Y939 Activity, unspecified: Secondary | ICD-10-CM | POA: Insufficient documentation

## 2016-07-08 DIAGNOSIS — R55 Syncope and collapse: Secondary | ICD-10-CM

## 2016-07-08 DIAGNOSIS — W1830XA Fall on same level, unspecified, initial encounter: Secondary | ICD-10-CM | POA: Insufficient documentation

## 2016-07-08 DIAGNOSIS — S0083XA Contusion of other part of head, initial encounter: Secondary | ICD-10-CM

## 2016-07-08 DIAGNOSIS — S161XXA Strain of muscle, fascia and tendon at neck level, initial encounter: Secondary | ICD-10-CM

## 2016-07-08 DIAGNOSIS — F17200 Nicotine dependence, unspecified, uncomplicated: Secondary | ICD-10-CM | POA: Insufficient documentation

## 2016-07-08 DIAGNOSIS — Z79899 Other long term (current) drug therapy: Secondary | ICD-10-CM | POA: Insufficient documentation

## 2016-07-08 DIAGNOSIS — R0789 Other chest pain: Secondary | ICD-10-CM

## 2016-07-08 DIAGNOSIS — Y999 Unspecified external cause status: Secondary | ICD-10-CM | POA: Insufficient documentation

## 2016-07-08 DIAGNOSIS — Y92009 Unspecified place in unspecified non-institutional (private) residence as the place of occurrence of the external cause: Secondary | ICD-10-CM | POA: Insufficient documentation

## 2016-07-08 HISTORY — DX: Syncope and collapse: R55

## 2016-07-08 LAB — CBC
HCT: 35.8 % — ABNORMAL LOW (ref 36.0–46.0)
Hemoglobin: 11.1 g/dL — ABNORMAL LOW (ref 12.0–15.0)
MCH: 21.3 pg — AB (ref 26.0–34.0)
MCHC: 31 g/dL (ref 30.0–36.0)
MCV: 68.6 fL — AB (ref 78.0–100.0)
PLATELETS: 200 10*3/uL (ref 150–400)
RBC: 5.22 MIL/uL — ABNORMAL HIGH (ref 3.87–5.11)
RDW: 18.4 % — ABNORMAL HIGH (ref 11.5–15.5)
WBC: 11.3 10*3/uL — AB (ref 4.0–10.5)

## 2016-07-08 LAB — BASIC METABOLIC PANEL
Anion gap: 5 (ref 5–15)
BUN: 8 mg/dL (ref 6–20)
CHLORIDE: 109 mmol/L (ref 101–111)
CO2: 25 mmol/L (ref 22–32)
CREATININE: 0.78 mg/dL (ref 0.44–1.00)
Calcium: 8.6 mg/dL — ABNORMAL LOW (ref 8.9–10.3)
GFR calc Af Amer: >60 mL/min (ref >60)
GFR calc non Af Amer: >60 mL/min (ref >60)
GLUCOSE: 94 mg/dL (ref 65–99)
Potassium: 3.9 mmol/L (ref 3.5–5.1)
SODIUM: 139 mmol/L (ref 135–145)

## 2016-07-08 LAB — URINALYSIS, ROUTINE W REFLEX MICROSCOPIC
BILIRUBIN URINE: NEGATIVE
GLUCOSE, UA: NEGATIVE mg/dL
HGB URINE DIPSTICK: NEGATIVE
Ketones, ur: 5 mg/dL — AB
Leukocytes, UA: NEGATIVE
Nitrite: NEGATIVE
PH: 5 (ref 5.0–8.0)
Protein, ur: NEGATIVE mg/dL
SPECIFIC GRAVITY, URINE: 1.018 (ref 1.005–1.030)

## 2016-07-08 LAB — I-STAT TROPONIN, ED: Troponin i, poc: 0 ng/mL (ref 0.00–0.08)

## 2016-07-08 LAB — D-DIMER, QUANTITATIVE: D-Dimer, Quant: <0.27 ug/mL-FEU (ref 0.00–0.50)

## 2016-07-08 MED ORDER — METHOCARBAMOL 500 MG PO TABS: 500.0000 mg | ORAL_TABLET | Freq: Three times a day (TID) | ORAL | 0 refills | Status: DC | PRN

## 2016-07-08 MED ORDER — IBUPROFEN 600 MG PO TABS: 600.0000 mg | ORAL_TABLET | Freq: Three times a day (TID) | ORAL | 0 refills | Status: DC | PRN

## 2016-07-08 NOTE — Discharge Instructions (Signed)
Establish care with a primary physician for follow-up and routine medical care. Take medication as prescribed. You may also take Tylenol. You may use heating pad for muscular pain. Return immediately for worsening pain, weakness, vision changes, persistent vomiting or for any concerns.

## 2016-07-08 NOTE — ED Notes (Signed)
During orthostatic vitals, pt stated that she felt pain in her chest and her mouth. Pt did not know if she hurt her ribs, when she passed out.

## 2016-07-08 NOTE — ED Notes (Signed)
Gave pt Diet Ginger Ale, per Dr. Lita Mains.

## 2016-07-08 NOTE — ED Provider Notes (Signed)
[facility] DEPT Provider Note   CSN: [id number] Arrival date & time: 07/08/16  1654     History   Chief Complaint Chief Complaint  Patient presents with  . Loss of Consciousness  . Chest Pain    "due to fall"    HPI Makayla Duran is a 66 y.o. female.  HPI Patient presents with left-sided chest wall pain and facial pain after losing consciousness and collapsing 2 days ago. Patient states that she became dizzy prior to the syncopal episode. Unknown length of time she was unconscious. 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 Unable to describe dizziness prior to loss of consciousness. Per nurse's notes patient had used cocaine. She now complains of ongoing left-sided chest wall pain which is worse with movement. No difficulty breathing. She also complains of facial pain especially over the jaw and lower teeth. Patient has diffuse neck and thoracic back pain. No focal weakness or numbness. Patient denies current dizziness. Past Medical History:  Diagnosis Date  . Syncope     There are no active problems to display for this patient.   History reviewed. No pertinent surgical history.  OB History    No data available       Home Medications    Prior to Admission medications   Medication Sig Start Date End Date Taking? Authorizing Provider  aspirin EC 325 MG tablet Take 325 mg by mouth 3 (three) times daily. FOR CHEST AND DENTAL-RELATED PAIN   Yes Historical Provider, MD  ferrous sulfate 325 (65 FE) MG tablet Take 325 mg by mouth daily with breakfast.   Yes Historical Provider, MD  ibuprofen (ADVIL,MOTRIN) 600 MG tablet Take 1 tablet (600 mg total) by mouth 3 (three) times daily with meals as needed for moderate pain. 07/08/16   Julianne Rice, MD  methocarbamol (ROBAXIN) 500 MG tablet Take 1 tablet (500 mg total) by mouth every 8 (eight) hours as needed for muscle spasms. 07/08/16   Julianne Rice, MD    Family History No  family history on file.  Social History Social History  Substance Use Topics  . Smoking status: Current Every Day Smoker  . Smokeless tobacco: Current User  . Alcohol use Yes     Allergies   Other   Review of Systems Review of Systems  Constitutional: Negative for chills and fever.  HENT: Positive for dental problem. Negative for congestion and facial swelling.   Respiratory: Negative for shortness of breath.   Cardiovascular: Positive for chest pain. Negative for palpitations and leg swelling.  Gastrointestinal: Negative for abdominal pain, diarrhea, nausea and vomiting.  Genitourinary: Negative for dysuria, flank pain and frequency.  Musculoskeletal: Positive for back pain, myalgias and neck pain.  Skin: Negative for rash and wound.  Neurological: Positive for dizziness and syncope. Negative for weakness, numbness and headaches.  All other systems reviewed and are negative.    Physical Exam Updated Vital Signs BP 134/75 (BP Location: Left Arm)   Pulse 68   Temp 97.7 F (36.5 C) (Oral) Comment: pt drinking soda  Resp 14   Ht 5\' 5"  (1.651 m)   Wt 135 lb (61.2 kg)   SpO2 100%   BMI 22.47 kg/m   Physical Exam  Constitutional: She is oriented to person, place, and time. She appears well-developed and well-nourished. No distress.  HENT:  Head: Normocephalic and atraumatic.  Mouth/Throat: Oropharynx is clear and moist.  Patient with mild abrasion to the mucosal surface of the lower lip. She  has diffuse tenderness to palpation over the anterior mandible and inferior teeth. There is no loosening of the teeth. No obvious swelling. No malocclusion. Midface is stable.  Eyes: EOM are normal. Pupils are equal, round, and reactive to light.  Neck: Normal range of motion. Neck supple.  Mild diffuse posterior cervical tenderness to palpation. There is no focality, step offs or deformity.  Cardiovascular: Normal rate and regular rhythm.  Exam reveals no gallop and no friction rub.    No murmur heard. Pulmonary/Chest: Effort normal and breath sounds normal. No respiratory distress. She has no wheezes. She has no rales. She exhibits tenderness.  Left parasternal tenderness to palpation. There is no crepitance or deformity.  Abdominal: Soft. Bowel sounds are normal. There is no tenderness. There is no rebound and no guarding.  Musculoskeletal: Normal range of motion. She exhibits no edema or tenderness.  Distal pulses are 2+. No lower extremity swelling or asymmetry. Diffuse thoracic midline tenderness. No step-offs or deformity.  Neurological: She is alert and oriented to person, place, and time.  Patient is alert and oriented x3 with clear, goal oriented speech. Patient has 5/5 motor in all extremities. Sensation is intact to light touch. Bilateral finger-to-nose is normal with no signs of dysmetria. Patient has a normal gait and walks without assistance.  Skin: Skin is warm and dry. Capillary refill takes less than 2 seconds. No rash noted. No erythema.  Psychiatric: She has a normal mood and affect. Her behavior is normal.  Nursing note and vitals reviewed.    ED Treatments / Results  Labs (all labs ordered are listed, but only abnormal results are displayed) Labs Reviewed  BASIC METABOLIC PANEL - Abnormal; Notable for the following:       Result Value   Calcium 8.6 (*)    All other components within normal limits  CBC - Abnormal; Notable for the following:    WBC 11.3 (*)    RBC 5.22 (*)    Hemoglobin 11.1 (*)    HCT 35.8 (*)    MCV 68.6 (*)    MCH 21.3 (*)    RDW 18.4 (*)    All other components within normal limits  URINALYSIS, ROUTINE W REFLEX MICROSCOPIC - Abnormal; Notable for the following:    Ketones, ur 5 (*)    All other components within normal limits  D-DIMER, QUANTITATIVE (NOT AT [facility])  I-STAT TROPOININ, ED    EKG  EKG Interpretation  Date/Time:  Thursday July 08 2016 17:00:16 EDT Ventricular Rate:  63 PR Interval:  132 QRS  Duration: 92 QT Interval:  404 QTC Calculation: 413 R Axis:   75 Text Interpretation:  Normal sinus rhythm Incomplete right bundle branch block Borderline ECG Confirmed by Lita Mains  MD, Nazaire Cordial ([id number]) on 07/08/2016 7:42:51 PM       Radiology Dg Chest 2 View  Result Date: 07/08/2016 CLINICAL DATA:  Left breast and anterior chest pain after falling 2 days ago. EXAM: CHEST  2 VIEW COMPARISON:  None. FINDINGS: Normal sized heart. Clear lungs. Minimal scoliosis. No fracture or pneumothorax seen. IMPRESSION: No acute abnormality. Electronically Signed   By: Claudie Revering M.D.   On: 07/08/2016 18:23   Ct Head Wo Contrast  Result Date: 07/08/2016 CLINICAL DATA:  Head injury after falling at home today. EXAM: CT HEAD WITHOUT CONTRAST CT CERVICAL SPINE WITHOUT CONTRAST TECHNIQUE: Multidetector CT imaging of the head and cervical spine was performed following the standard protocol without intravenous contrast. Multiplanar CT image reconstructions of the cervical  spine were also generated. COMPARISON:  None. FINDINGS: CT HEAD FINDINGS Brain: No evidence of acute infarction, hemorrhage, hydrocephalus, extra-axial collection or mass lesion/mass effect. Vascular: No hyperdense vessel or unexpected calcification. Skull: Normal. Negative for fracture or focal lesion. Sinuses/Orbits: No acute finding. Other: None. CT CERVICAL SPINE FINDINGS Alignment: Normal.  No subluxations. Skull base and vertebrae: No acute fracture. Small rounded, circumscribed fat density areas in the C1 and C2 vertebrae, compatible with hemangiomas. Soft tissues and spinal canal: No prevertebral fluid or swelling. No visible canal hematoma. Disc levels:  Multilevel degenerative changes. Upper chest: Clear lung apices. Other: Unremarkable thyroid gland. Small amount of left carotid artery calcification. IMPRESSION: 1. Normal head CT. 2. No cervical spine fracture or subluxation. 3. Multilevel cervical spine degenerative changes. 4. Small amount of  left carotid artery atheromatous calcification. Electronically Signed   By: Claudie Revering M.D.   On: 07/08/2016 21:19   Ct Cervical Spine Wo Contrast  Result Date: 07/08/2016 CLINICAL DATA:  Head injury after falling at home today. EXAM: CT HEAD WITHOUT CONTRAST CT CERVICAL SPINE WITHOUT CONTRAST TECHNIQUE: Multidetector CT imaging of the head and cervical spine was performed following the standard protocol without intravenous contrast. Multiplanar CT image reconstructions of the cervical spine were also generated. COMPARISON:  None. FINDINGS: CT HEAD FINDINGS Brain: No evidence of acute infarction, hemorrhage, hydrocephalus, extra-axial collection or mass lesion/mass effect. Vascular: No hyperdense vessel or unexpected calcification. Skull: Normal. Negative for fracture or focal lesion. Sinuses/Orbits: No acute finding. Other: None. CT CERVICAL SPINE FINDINGS Alignment: Normal.  No subluxations. Skull base and vertebrae: No acute fracture. Small rounded, circumscribed fat density areas in the C1 and C2 vertebrae, compatible with hemangiomas. Soft tissues and spinal canal: No prevertebral fluid or swelling. No visible canal hematoma. Disc levels:  Multilevel degenerative changes. Upper chest: Clear lung apices. Other: Unremarkable thyroid gland. Small amount of left carotid artery calcification. IMPRESSION: 1. Normal head CT. 2. No cervical spine fracture or subluxation. 3. Multilevel cervical spine degenerative changes. 4. Small amount of left carotid artery atheromatous calcification. Electronically Signed   By: Claudie Revering M.D.   On: 07/08/2016 21:19    Procedures Procedures (including critical care time)  Medications Ordered in ED Medications - No data to display   Initial Impression / Assessment and Plan / ED Course  I have reviewed the triage vital signs and the nursing notes.  Pertinent labs & imaging results that were available during my care of the patient were reviewed by me and considered  in my medical decision making (see chart for details).     Patient remained hemodynamically stable. CT head and neck without acute findings. Remains neurologically intact. Chest pain is completely reproduced with palpation over the anterior chest. The suspicion for cardiac cause of patient's chest pain. Troponin is normal. Patient also has a normal d-dimer. She is advised to obtain a primary care physician and follow closely. Return precautions been given.  Final Clinical Impressions(s) / ED Diagnoses   Final diagnoses:  Chest wall pain  Syncope and collapse  Cervical strain, acute, initial encounter  Facial contusion, initial encounter    New Prescriptions New Prescriptions   IBUPROFEN (ADVIL,MOTRIN) 600 MG TABLET    Take 1 tablet (600 mg total) by mouth 3 (three) times daily with meals as needed for moderate pain.   METHOCARBAMOL (ROBAXIN) 500 MG TABLET    Take 1 tablet (500 mg total) by mouth every 8 (eight) hours as needed for muscle spasms.  Julianne Rice, MD 07/08/16 2225

## 2016-07-08 NOTE — ED Triage Notes (Signed)
Pt. Stated, I have a a history of passing out and because I fell It makes my chest hurt and hit my fasce.  I continue to have the chest pain and not sure if its because of fall.

## 2016-07-08 NOTE — ED Triage Notes (Signed)
Pt. Stated, I take cocaine, pt. Had cocaine before passing out on Tuesday.

## 2016-07-09 ENCOUNTER — Encounter (HOSPITAL_COMMUNITY): Payer: Self-pay

## 2018-03-09 ENCOUNTER — Encounter (HOSPITAL_COMMUNITY): Payer: Self-pay

## 2018-03-09 ENCOUNTER — Ambulatory Visit (HOSPITAL_COMMUNITY)
Admission: EM | Admit: 2018-03-09 | Discharge: 2018-03-09 | Disposition: A | Discharge status: Home / Self Care | Payer: Self-pay | Attending: Family Medicine | Admitting: Family Medicine

## 2018-03-09 ENCOUNTER — Ambulatory Visit (INDEPENDENT_AMBULATORY_CARE_PROVIDER_SITE_OTHER): Payer: Self-pay

## 2018-03-09 DIAGNOSIS — M5442 Lumbago with sciatica, left side: Secondary | ICD-10-CM

## 2018-03-09 DIAGNOSIS — M5441 Lumbago with sciatica, right side: Secondary | ICD-10-CM

## 2018-03-09 MED ORDER — KETOROLAC TROMETHAMINE 60 MG/2ML IM SOLN
INTRAMUSCULAR | Status: AC
  Filled 2018-03-09: qty 2

## 2018-03-09 MED ORDER — NAPROXEN 500 MG PO TABS: 500.0000 mg | ORAL_TABLET | Freq: Two times a day (BID) | ORAL | 0 refills | Status: DC

## 2018-03-09 MED ORDER — KETOROLAC TROMETHAMINE 60 MG/2ML IM SOLN
60.0000 mg | Freq: Once | INTRAMUSCULAR | Status: AC
  Administered 2018-03-09: 60 mg via INTRAMUSCULAR

## 2018-03-09 MED ORDER — METHOCARBAMOL 500 MG PO TABS: 500.0000 mg | ORAL_TABLET | Freq: Three times a day (TID) | ORAL | 0 refills | Status: DC

## 2018-03-09 NOTE — Discharge Instructions (Signed)
We gave you a shot of Toradol today Please continue to take Naprosyn twice daily with food on your stomach for the next 1 to 2 weeks May use Robaxin 2-3 times a day, this is a muscle relaxer may cause drowsiness, do not drive after taking Alternate ice and heat to lower back Please avoid complete bedrest, go about daily activities, but do not do any heavy lifting  Please follow-up if having worsening pain, weakness in legs, loss of control of bowel or bladder, worsening numbness or tingling

## 2018-03-09 NOTE — ED Triage Notes (Signed)
Pt presents with progressively worsening lower back pain that started a week and a half ago after feeling a weird sensation in her back while making the bed.  Reports the pain radiates down both legs. Denies any issues using the bathroom.

## 2018-03-10 NOTE — ED Provider Notes (Signed)
[place] [facility]    CSN: [id number] Arrival date & time: 03/09/18  1239     History   Chief Complaint Chief Complaint  Patient presents with  . Back Pain    1    HPI Makayla Duran is a 67 y.o. female no contributing past medical history presenting today for evaluation of low back pain.  Patient states that she has had low back pain over the past week.  Patient symptoms began as she was putting a fitted sheet on her bed, all of a sudden she felt a sharp pain in her lower back.  She continued with making the bed and a few minutes later she felt this sharp pain again that brought her to her knees.  Pain worsens with movement.  States that the pain is in her lower back on both sides and will radiate into both of her thighs.  The only way that she is comfortable and without pain is lying flat.  She has tried over-the-counter medicines as well as himself.  Over-the-counter medicines tried including naproxen, aspirin, acetaminophen.  Denies previous issues with her back.  Denies saddle anesthesia.  Denies changes in urination or bowel movements.  Denies leg weakness.  Patient is concerned as her sister passed away from bone cancer.   HPI  Past Medical History:  Diagnosis Date  . Syncope     There are no active problems to display for this patient.   Past Surgical History:  Procedure Laterality Date  . ABDOMINAL HYSTERECTOMY    . TONSILLECTOMY      OB History   None      Home Medications    Prior to Admission medications   Medication Sig Start Date End Date Taking? Authorizing Provider  aspirin EC 325 MG tablet Take 325 mg by mouth 3 (three) times daily. FOR CHEST AND DENTAL-RELATED PAIN    [provider]  ferrous gluconate (FERGON) 324 MG tablet Take 324 mg by mouth daily with breakfast. For anemia. Patient states that she takes up to 6 tablets by mouth three times a day    [provider]  ferrous sulfate 325 (65 FE) MG tablet Take 325 mg by  mouth daily with breakfast.    [provider]  ibuprofen (ADVIL,MOTRIN) 600 MG tablet Take 1 tablet (600 mg total) by mouth 3 (three) times daily with meals as needed for moderate pain. 07/08/16   Julianne Rice, MD  methocarbamol (ROBAXIN) 500 MG tablet Take 1 tablet (500 mg total) by mouth 3 (three) times daily. 03/09/18   Jiovanny Burdell C, PA-C  naproxen (NAPROSYN) 500 MG tablet Take 1 tablet (500 mg total) by mouth 2 (two) times daily. 03/09/18   Chanele Douglas, Elesa Hacker, PA-C    Family History Family History  Problem Relation Age of Onset  . Heart failure Mother   . Cancer Mother   . Heart failure Father     Social History Social History   Tobacco Use  . Smoking status: Current Every Day Smoker  . Smokeless tobacco: Current User  Substance Use Topics  . Alcohol use: Yes    Comment: occasionally  . Drug use: Yes    Types: Cocaine     Allergies   Other and Other   Review of Systems Review of Systems  Constitutional: Negative for fatigue and fever.  Eyes: Negative for visual disturbance.  Respiratory: Negative for shortness of breath.   Cardiovascular: Negative for chest pain.  Gastrointestinal: Negative for abdominal pain, constipation,  nausea and vomiting.  Genitourinary: Negative for decreased urine volume and difficulty urinating.  Musculoskeletal: Positive for back pain and gait problem. Negative for arthralgias and joint swelling.  Skin: Negative for color change, rash and wound.  Neurological: Negative for dizziness, weakness, light-headedness and headaches.     Physical Exam Triage Vital Signs ED Triage Vitals  Enc Vitals Group     BP 03/09/18 1306 (!) 141/70     Pulse Rate 03/09/18 1306 67     Resp 03/09/18 1306 20     Temp 03/09/18 1306 98.4 F (36.9 C)     Temp src --      SpO2 03/09/18 1306 100 %     Weight --      Height --      Head Circumference --      Peak Flow --      Pain Score 03/09/18 1303 8     Pain Loc --      Pain Edu? --        Excl. in [place]? --    No data found.  Updated Vital Signs BP (!) 141/70   Pulse 67   Temp 98.4 F (36.9 C)   Resp 20   SpO2 100%   Visual Acuity Right Eye Distance:   Left Eye Distance:   Bilateral Distance:    Right Eye Near:   Left Eye Near:    Bilateral Near:     Physical Exam  Constitutional: She is oriented to person, place, and time. She appears well-developed and well-nourished. No distress.  Pleasant   HENT:  Head: Normocephalic and atraumatic.  Eyes: Conjunctivae are normal.  Neck: Neck supple.  Cardiovascular: Normal rate and regular rhythm.  No murmur heard. Pulmonary/Chest: Effort normal and breath sounds normal. No respiratory distress.  Abdominal: Soft. There is no tenderness.  Musculoskeletal: She exhibits no edema.  Nontender to palpation of cervical and thoracic spine, tenderness throughout lumbar spine midline, brings patient tears, tenderness throughout bilateral lumbar musculature, positive straight leg raise bilaterally  Strength in hips 5/5 and equal bilaterally, but does also cause patient significant discomfort Knee strength 5/5 bilaterally Patellar reflex 2+  Neurological: She is alert and oriented to person, place, and time.  Skin: Skin is warm and dry.  Psychiatric: She has a normal mood and affect.  Nursing note and vitals reviewed.    UC Treatments / Results  Labs (all labs ordered are listed, but only abnormal results are displayed) Labs Reviewed - No data to display  EKG None  Radiology Dg Lumbar Spine Complete  Result Date: 03/09/2018 CLINICAL DATA:  Low back pain for a week after bending over. EXAM: LUMBAR SPINE - COMPLETE 4+ VIEW COMPARISON:  A CT abdomen and pelvis May 30, 2008 FINDINGS: Five non rib-bearing lumbar-type vertebral bodies are intact. No malalignment. Maintained lumbar lordosis. Intervertebral disc heights maintained, mild ventral endplate spurring. Mild L5-S1 facet arthropathy. No destructive bony  lesions. Sacroiliac joints are symmetric. Included prevertebral and paraspinal soft tissue planes are non-suspicious. IMPRESSION: 1. Mild degenerative change of the lumbar spine without fracture deformity or malalignment. Electronically Signed   By: Elon Alas M.D.   On: 03/09/2018 14:14    Procedures Procedures (including critical care time)  Medications Ordered in UC Medications  ketorolac (TORADOL) injection 60 mg (60 mg Intramuscular Given 03/09/18 1451)    Initial Impression / Assessment and Plan / UC Course  I have reviewed the triage vital signs and the nursing notes.  Pertinent labs &  imaging results that were available during my care of the patient were reviewed by me and considered in my medical decision making (see chart for details).     No red flags for cauda equina, lumbar x-ray show mild degenerative changes to the lower lumbar region.  Most likely lumbar strain with radicular distribution.  Patient seems to been avoiding movement.  Will recommend against bedrest.  Offered prednisone as alternative as she has been using NSAIDs, patient concerned about this is previously made her irritable and does not want to gain weight.  Offered Naprosyn as alternative, Toradol prior to discharge.  Robaxin to supplement as needed.  Discussed possibility of drowsiness regarding Robaxin.  Alternate ice and heat.  Recommended allowing more time for resolution of injury.Discussed strict return precautions. Patient verbalized understanding and is agreeable with plan.  Final Clinical Impressions(s) / UC Diagnoses   Final diagnoses:  Acute bilateral low back pain with bilateral sciatica     Discharge Instructions     We gave you a shot of Toradol today Please continue to take Naprosyn twice daily with food on your stomach for the next 1 to 2 weeks May use Robaxin 2-3 times a day, this is a muscle relaxer may cause drowsiness, do not drive after taking Alternate ice and heat to lower  back Please avoid complete bedrest, go about daily activities, but do not do any heavy lifting  Please follow-up if having worsening pain, weakness in legs, loss of control of bowel or bladder, worsening numbness or tingling   ED Prescriptions    Medication Sig Dispense Auth. Provider   naproxen (NAPROSYN) 500 MG tablet Take 1 tablet (500 mg total) by mouth 2 (two) times daily. 30 tablet Makaila Windle C, PA-C   methocarbamol (ROBAXIN) 500 MG tablet Take 1 tablet (500 mg total) by mouth 3 (three) times daily. 20 tablet Kamalani Mastro, [place] C, PA-C     Controlled Substance Prescriptions [place] Controlled Substance Registry consulted? Not Applicable   Janith Lima, [place] 03/10/18 0830

## 2018-11-08 ENCOUNTER — Other Ambulatory Visit: Payer: Self-pay

## 2018-11-08 DIAGNOSIS — Z20822 Contact with and (suspected) exposure to covid-19: Secondary | ICD-10-CM

## 2018-11-12 LAB — NOVEL CORONAVIRUS, NAA: SARS-CoV-2, NAA: NOT DETECTED

## 2019-05-18 ENCOUNTER — Encounter: Payer: Self-pay | Admitting: Gastroenterology

## 2019-05-19 ENCOUNTER — Other Ambulatory Visit: Payer: Self-pay | Admitting: Internal Medicine

## 2019-05-19 DIAGNOSIS — Z1231 Encounter for screening mammogram for malignant neoplasm of breast: Secondary | ICD-10-CM

## 2019-05-23 ENCOUNTER — Ambulatory Visit (AMBULATORY_SURGERY_CENTER): Payer: Self-pay | Admitting: *Deleted

## 2019-05-23 ENCOUNTER — Other Ambulatory Visit: Payer: Self-pay

## 2019-05-23 VITALS — Temp 98.3°F | Ht 65.0 in | Wt 164.0 lb

## 2019-05-23 DIAGNOSIS — Z01818 Encounter for other preprocedural examination: Secondary | ICD-10-CM

## 2019-05-23 DIAGNOSIS — Z1211 Encounter for screening for malignant neoplasm of colon: Secondary | ICD-10-CM

## 2019-05-23 MED ORDER — NA SULFATE-K SULFATE-MG SULF 17.5-3.13-1.6 GM/177ML PO SOLN: 1.0000 | Freq: Once | ORAL | 0 refills | Status: AC

## 2019-05-23 NOTE — Progress Notes (Signed)

## 2019-06-04 ENCOUNTER — Ambulatory Visit (INDEPENDENT_AMBULATORY_CARE_PROVIDER_SITE_OTHER): Payer: Medicare Other

## 2019-06-04 ENCOUNTER — Other Ambulatory Visit: Payer: Self-pay | Admitting: Gastroenterology

## 2019-06-04 DIAGNOSIS — Z1159 Encounter for screening for other viral diseases: Secondary | ICD-10-CM

## 2019-06-05 LAB — SARS CORONAVIRUS 2 (TAT 6-24 HRS): SARS Coronavirus 2: NEGATIVE

## 2019-06-06 ENCOUNTER — Encounter: Payer: Self-pay | Admitting: Gastroenterology

## 2019-06-06 ENCOUNTER — Other Ambulatory Visit: Payer: Self-pay

## 2019-06-06 ENCOUNTER — Ambulatory Visit (AMBULATORY_SURGERY_CENTER): Payer: Medicare Other | Admitting: Gastroenterology

## 2019-06-06 VITALS — BP 124/66 | HR 60 | Temp 96.8°F | Resp 20 | Ht 65.0 in | Wt 164.0 lb

## 2019-06-06 DIAGNOSIS — K635 Polyp of colon: Secondary | ICD-10-CM | POA: Diagnosis not present

## 2019-06-06 DIAGNOSIS — D120 Benign neoplasm of cecum: Secondary | ICD-10-CM

## 2019-06-06 DIAGNOSIS — D123 Benign neoplasm of transverse colon: Secondary | ICD-10-CM

## 2019-06-06 DIAGNOSIS — Z1211 Encounter for screening for malignant neoplasm of colon: Secondary | ICD-10-CM | POA: Diagnosis not present

## 2019-06-06 MED ORDER — SODIUM CHLORIDE 0.9 % IV SOLN: 500.0000 mL | Freq: Once | INTRAVENOUS | Status: DC

## 2019-06-06 NOTE — Progress Notes (Signed)
Report given to PACU, vss 

## 2019-06-06 NOTE — Progress Notes (Signed)
Called to room to assist during endoscopic procedure.  Patient ID and intended procedure confirmed with present staff. Received instructions for my participation in the procedure from the performing physician.  

## 2019-06-06 NOTE — Progress Notes (Signed)
Pt's states no medical or surgical changes since previsit or office visit.  Vitals- Donna Temp- June 

## 2019-06-06 NOTE — Op Note (Signed)
[facility] Patient Name: Makayla Duran Procedure Date: 06/06/2019 2:06 PM MRN: [id number] Endoscopist: Remo Lipps P. Havery Moros , MD Age: 69 Referring MD:  Date of Birth: Apr 23, 1950 Gender: Female Account #: [id number] Procedure:                Colonoscopy Indications:              Screening for colorectal malignant neoplasm, This                            is the patient's first colonoscopy Medicines:                Monitored Anesthesia Care Procedure:                Pre-Anesthesia Assessment:                           - Prior to the procedure, a History and Physical                            was performed, and patient medications and                            allergies were reviewed. The patient's tolerance of                            previous anesthesia was also reviewed. The risks                            and benefits of the procedure and the sedation                            options and risks were discussed with the patient.                            All questions were answered, and informed consent                            was obtained. Prior Anticoagulants: The patient has                            taken no previous anticoagulant or antiplatelet                            agents. ASA Grade Assessment: I - A normal, healthy                            patient. After reviewing the risks and benefits,                            the patient was deemed in satisfactory condition to                            undergo the procedure.  After obtaining informed consent, the colonoscope                            was passed under direct vision. Throughout the                            procedure, the patient's blood pressure, pulse, and                            oxygen saturations were monitored continuously. The                            Colonoscope was introduced through the anus and                            advanced to the the cecum,  identified by                            appendiceal orifice and ileocecal valve. The                            colonoscopy was performed without difficulty. The                            patient tolerated the procedure well. The quality                            of the bowel preparation was good. The ileocecal                            valve, appendiceal orifice, and rectum were                            photographed. Scope In: 2:08:47 PM Scope Out: 2:30:23 PM Scope Withdrawal Time: 0 hours 17 minutes 7 seconds  Total Procedure Duration: 0 hours 21 minutes 36 seconds  Findings:                 The perianal and digital rectal examinations were                            normal.                           The colon was tortuous.                           A 3 mm polyp was found in the cecum. The polyp was                            sessile. The polyp was removed with a cold snare.                            Resection and retrieval were complete.  A 3 mm polyp was found in the transverse colon. The                            polyp was sessile. The polyp was removed with a                            cold snare. Resection and retrieval were complete.                           The exam was otherwise without abnormality. Complications:            No immediate complications. Estimated blood loss:                            Minimal. Estimated Blood Loss:     Estimated blood loss was minimal. Impression:               - Tortuous colon.                           - One 3 mm polyp in the cecum, removed with a cold                            snare. Resected and retrieved.                           - One 3 mm polyp in the transverse colon, removed                            with a cold snare. Resected and retrieved.                           - The examination was otherwise normal. Recommendation:           - Patient has a contact number available for                             emergencies. The signs and symptoms of potential                            delayed complications were discussed with the                            patient. Return to normal activities tomorrow.                            Written discharge instructions were provided to the                            patient.                           - Resume previous diet.                           - Continue  present medications.                           - Await pathology results. Remo Lipps P. Kyrell Ruacho, MD 06/06/2019 2:34:07 PM This report has been signed electronically.

## 2019-06-06 NOTE — Patient Instructions (Signed)
Please read handouts provided. Continue present medications. Await pathology results.        YOU HAD AN ENDOSCOPIC PROCEDURE TODAY AT THE [facility]:   Refer to the procedure report that was given to you for any specific questions about what was found during the examination.  If the procedure report does not answer your questions, please call your gastroenterologist to clarify.  If you requested that your care partner not be given the details of your procedure findings, then the procedure report has been included in a sealed envelope for you to review at your convenience later.  YOU SHOULD EXPECT: Some feelings of bloating in the abdomen. Passage of more gas than usual.  Walking can help get rid of the air that was put into your GI tract during the procedure and reduce the bloating. If you had a lower endoscopy (such as a colonoscopy or flexible sigmoidoscopy) you may notice spotting of blood in your stool or on the toilet paper. If you underwent a bowel prep for your procedure, you may not have a normal bowel movement for a few days.  Please Note:  You might notice some irritation and congestion in your nose or some drainage.  This is from the oxygen used during your procedure.  There is no need for concern and it should clear up in a day or so.  SYMPTOMS TO REPORT IMMEDIATELY:   Following lower endoscopy (colonoscopy or flexible sigmoidoscopy):  Excessive amounts of blood in the stool  Significant tenderness or worsening of abdominal pains  Swelling of the abdomen that is new, acute  Fever of 100F or higher    For urgent or emergent issues, a gastroenterologist can be reached at any hour by calling [phone].   DIET:  We do recommend a small meal at first, but then you may proceed to your regular diet.  Drink plenty of fluids but you should avoid alcoholic beverages for 24 hours.  ACTIVITY:  You should plan to take it easy for the rest of today and you should NOT  DRIVE or use heavy machinery until tomorrow (because of the sedation medicines used during the test).    FOLLOW UP: Our staff will call the number listed on your records 48-72 hours following your procedure to check on you and address any questions or concerns that you may have regarding the information given to you following your procedure. If we do not reach you, we will leave a message.  We will attempt to reach you two times.  During this call, we will ask if you have developed any symptoms of COVID 19. If you develop any symptoms (ie: fever, flu-like symptoms, shortness of breath, cough etc.) before then, please call [phone].  If you test positive for Covid 19 in the 2 weeks post procedure, please call and report this information to us.    If any biopsies were taken you will be contacted by phone or by letter within the next 1-3 weeks.  Please call us at [phone] if you have not heard about the biopsies in 3 weeks.    SIGNATURES/CONFIDENTIALITY: You and/or your care partner have signed paperwork which will be entered into your electronic medical record.  These signatures attest to the fact that that the information above on your After Visit Summary has been reviewed and is understood.  Full responsibility of the confidentiality of this discharge information lies with you and/or your care-partner. 

## 2019-06-08 ENCOUNTER — Telehealth: Payer: Self-pay

## 2019-06-08 ENCOUNTER — Telehealth: Payer: Self-pay | Admitting: Gastroenterology

## 2019-06-08 NOTE — Telephone Encounter (Signed)
First post procedure follow up call, no answer 

## 2019-06-08 NOTE — Telephone Encounter (Signed)
Returned patient's phone call, got voice mail. Left message to please call back

## 2019-06-08 NOTE — Telephone Encounter (Signed)
2nd follow up call made.  NALM 

## 2019-06-26 ENCOUNTER — Other Ambulatory Visit: Payer: Self-pay

## 2019-06-26 ENCOUNTER — Ambulatory Visit
Admission: RE | Admit: 2019-06-26 | Discharge: 2019-06-26 | Disposition: A | Discharge status: Home / Self Care | Payer: Medicare Other | Source: Ambulatory Visit | Attending: Internal Medicine | Admitting: Internal Medicine

## 2019-06-26 DIAGNOSIS — Z1231 Encounter for screening mammogram for malignant neoplasm of breast: Secondary | ICD-10-CM

## 2019-06-27 ENCOUNTER — Other Ambulatory Visit: Payer: Self-pay | Admitting: Internal Medicine

## 2019-06-27 DIAGNOSIS — R928 Other abnormal and inconclusive findings on diagnostic imaging of breast: Secondary | ICD-10-CM

## 2019-07-05 ENCOUNTER — Other Ambulatory Visit: Payer: Self-pay

## 2019-07-05 ENCOUNTER — Ambulatory Visit
Admission: RE | Admit: 2019-07-05 | Discharge: 2019-07-05 | Disposition: A | Discharge status: Home / Self Care | Payer: Medicare Other | Source: Ambulatory Visit | Attending: Internal Medicine | Admitting: Internal Medicine

## 2019-07-05 ENCOUNTER — Other Ambulatory Visit: Payer: Self-pay | Admitting: Internal Medicine

## 2019-07-05 DIAGNOSIS — R928 Other abnormal and inconclusive findings on diagnostic imaging of breast: Secondary | ICD-10-CM

## 2019-09-12 DIAGNOSIS — H43399 Other vitreous opacities, unspecified eye: Secondary | ICD-10-CM | POA: Diagnosis not present

## 2019-10-02 DIAGNOSIS — L603 Nail dystrophy: Secondary | ICD-10-CM | POA: Diagnosis not present

## 2019-10-02 DIAGNOSIS — G8929 Other chronic pain: Secondary | ICD-10-CM | POA: Diagnosis not present

## 2019-10-02 DIAGNOSIS — M5442 Lumbago with sciatica, left side: Secondary | ICD-10-CM | POA: Diagnosis not present

## 2019-10-05 ENCOUNTER — Other Ambulatory Visit: Payer: Self-pay | Admitting: General Practice

## 2019-10-05 DIAGNOSIS — R5381 Other malaise: Secondary | ICD-10-CM

## 2019-12-03 ENCOUNTER — Ambulatory Visit: Payer: Self-pay | Admitting: Internal Medicine

## 2020-01-05 ENCOUNTER — Other Ambulatory Visit: Payer: Self-pay

## 2020-01-05 ENCOUNTER — Ambulatory Visit: Payer: Medicare Other | Attending: General Practice

## 2020-01-05 DIAGNOSIS — M545 Low back pain: Secondary | ICD-10-CM | POA: Insufficient documentation

## 2020-01-05 DIAGNOSIS — G8929 Other chronic pain: Secondary | ICD-10-CM | POA: Insufficient documentation

## 2020-01-05 DIAGNOSIS — M5136 Other intervertebral disc degeneration, lumbar region: Secondary | ICD-10-CM | POA: Diagnosis present

## 2020-01-05 DIAGNOSIS — M62830 Muscle spasm of back: Secondary | ICD-10-CM | POA: Diagnosis present

## 2020-01-07 ENCOUNTER — Other Ambulatory Visit: Payer: Medicare Other

## 2020-01-07 NOTE — Therapy (Signed)
[place], [place], [address] Phone: [phone]   Fax:  [phone]  Physical Therapy Evaluation  Patient Details  Name: Makayla Duran MRN: [id number] Date of Birth: 1950/11/16 Referring Provider (PT): Andree Moro, DO   Encounter Date: 01/05/2020   PT End of Session - 01/07/20 0914    Visit Number 1    Number of Visits 13    Date for PT Re-Evaluation 02/16/20    Authorization Type [employer]    Progress Note Due on Visit 10    PT Start Time 1038    PT Stop Time 1130    PT Time Calculation (min) 52 min    Activity Tolerance Patient tolerated treatment well    Behavior During Therapy [facility] for tasks assessed/performed           Past Medical History:  Diagnosis Date  . Anemia   . Arthritis    neck  . Blood transfusion without reported diagnosis    1990  . Meniere disease 2018  . Scoliosis of thoracolumbar spine   . Syncope   . Vertigo     Past Surgical History:  Procedure Laterality Date  . ABDOMINAL HYSTERECTOMY    . TONSILLECTOMY      There were no vitals filed for this visit.    Subjective Assessment - 01/07/20 0927    Subjective Pt states her low back started bothering her approx 2 years ago and seems related to a fall she had 3 years ago. The pain is intermittent ranging from 0-7/10 and she has intermittent numbness L thigh. Today she reports she is not having pain. Additionally, she reports having a lot of low back and general body pain following packing and moving.    Limitations Sitting;Standing;House hold activities    Diagnostic tests 11/19-IMPRESSION:1. Mild degenerative change of the lumbar spine without fracture deformity or malalignment.    Patient Stated Goals Exercise to reduce pain and stress    Currently in Pain? Yes    Pain Score 7    0-7/10 range   Pain Location Back    Pain Orientation Posterior;Mid;Lower    Pain Descriptors / Indicators  Aching;Tightness;Sharp    Pain Type Chronic pain;Neuropathic pain    Pain Radiating Towards NA    Pain Onset Other (comment)   2 years   Pain Frequency Intermittent    Aggravating Factors  prolonged positions, increased activitiy    Pain Relieving Factors Rest, medications, flexibility exs in AM    Effect of Pain on Daily Activities Decreased activity when hurting              [facility] PT Assessment - 01/07/20 0001      Assessment   Medical Diagnosis Other intervertebral disc degeneration, lumbar region; Sciatica, unspecified side    Referring Provider (PT) Andree Moro, DO    Onset Date/Surgical Date --   2 year   Hand Dominance Right    Prior Therapy No      Precautions   Precautions None      Restrictions   Weight Bearing Restrictions No      Balance Screen   Has the patient fallen in the past 6 months No      [place] residence    Living Arrangements Other relatives    Type of [place] to enter    Entrance Stairs-Number of Steps 2    Entrance Stairs-Rails  None    Home Layout Multi-level    Alternate Level Stairs-Number of Steps 5    Alternate Level Stairs-Rails Right;Left      Prior Function   Level of Independence Independent    Vocation Part time employment    Vocation Requirements receptionist      Cognition   Overall Cognitive Status Within Functional Limits for tasks assessed      Observation/Other Assessments   Focus on Therapeutic Outcomes (FOTO)  43% limitation      Sensation   Light Touch Appears Intact      Posture/Postural Control   Posture/Postural Control Postural limitations    Postural Limitations Forward head;Increased lumbar lordosis      ROM / Strength   AROM / PROM / Strength AROM;Strength      AROM   Overall AROM Comments Trunk movements and hamstring flexibility are WNLs with increased low back pain with ext and L SB. While trunk flexion was WNLs. Flexion of the lumbar  spine was dereased.      Strength   Overall Strength Comments LE myotomal testing negative      Palpation   Palpation comment TTP to the lubar paraspinals bilat with increase muscle tension      Special Tests    Special Tests Lumbar    Lumbar Tests Slump Test      Slump test   Findings Negative      Transfers   Transfers Sit to Stand;Stand to Sit    Sit to Stand 7: Independent      Ambulation/Gait   Ambulation/Gait Yes    Ambulation/Gait Assistance 7: Independent    Gait Pattern Within Functional Limits;Step-through pattern                      Objective measurements completed on examination: See above findings.       [facility] Adult PT Treatment/Exercise - 01/07/20 0001      Exercises   Exercises Lumbar      Lumbar Exercises: Stretches   Double Knee to Chest Stretch 3 reps;20 seconds    Other Lumbar Stretch Exercise Seated forward flexion 3x; 20sec      Lumbar Exercises: Supine   Pelvic Tilt 10 reps    Pelvic Tilt Limitations 3 sec    Dead Bug 10 reps    Dead Bug Limitations 3 sets; Legs only                  PT Education - 01/07/20 0932    Education Details Eval fidsings; POC, HEP, taking breaks from sitting every 30 mins    Person(s) Educated Patient    Methods Explanation;Demonstration;Tactile cues;Verbal cues;Handout;Other (comment)    Comprehension Verbalized understanding;Returned demonstration;Verbal cues required;Tactile cues required;Need further instruction            PT Short Term Goals - 01/07/20 1033      PT SHORT TERM GOAL #1   Title Pt will be Ind in an initial HEP    Baseline Satreted on eval    Period Weeks    Status New    Target Date 01/28/20      PT SHORT TERM GOAL #2   Title Review FOTO results    Status New    Target Date 01/21/20      PT SHORT TERM GOAL #3   Title Pt will voice understanding of measures to reduce and manage low back pain    Status New    Target Date 01/28/20  PT Long  Term Goals - 01/07/20 1036      PT LONG TERM GOAL #1   Title Pt will report greater ease in completing household activities to no difficulty    Baseline A little difficulty    Status New    Target Date 02/16/20      PT LONG TERM GOAL #2   Title Pt' FOTO score will improve to 37% limitation    Baseline 43%    Status New    Target Date 02/16/20      PT LONG TERM GOAL #3   Title Pt will demonstrate increase lumbar flexion mobility to reduce lumbar pain with prolonged positions    Status New    Target Date 02/16/20      PT LONG TERM GOAL #4   Title Pt will report improved low back pain c daily activites with a decreased frequency and pain range 0-3/10    Baseline 0-7/10    Status New    Target Date 02/16/20      PT LONG TERM GOAL #5   Title Pt will be Ind in a final HEP    Status New    Target Date 02/16/20                  Plan - 01/07/20 0945    Clinical Impression Statement Pt presents with intermittent low back pain affected by prolonged positions and increased activitiy. Trunk ROM and hamstring flexibility were found WNLs, but lumbar flexion was decreased. Prosturally, pt has increased lordosis with tender and tight lumbar paraspinals. Pt was provided with exs to address lumbar flexion ROM and abdominal strengthening. Pt will benefit from PT 2w6 to address lumbar ROM, abdominal and trunk strength, postural and body mechanic education, and modalities to decrease pain and improve function.    Personal Factors and Comorbidities Comorbidity 2    Comorbidities arhtritis, thoracolumbar scoliosis    Examination-Activity Limitations Sit;Squat;Stand;Lift    Stability/Clinical Decision Making Stable/Uncomplicated    Clinical Decision Making Low    Rehab Potential Good    PT Frequency 2x / week    PT Duration 6 weeks    PT Treatment/Interventions ADLs/Self Care Home Management;Cryotherapy;Electrical Stimulation;Iontophoresis 4mg /ml Dexamethasone;Moist  Heat;Traction;Ultrasound;Therapeutic exercise;Patient/family education;Manual techniques;Passive range of motion;Dry needling;Taping    PT Next Visit Plan Assess response to HEP. Instruct in proper posture and body mechanics. Progress ther ex/HEP and use modalities as indicated.    PT Home Exercise Plan [id number]    Consulted and Agree with Plan of Care Patient           Patient will benefit from skilled therapeutic intervention in order to improve the following deficits and impairments:  Decreased range of motion, Increased muscle spasms, Decreased activity tolerance, Pain, Improper body mechanics, Decreased strength, Postural dysfunction  Visit Diagnosis: Muscle spasm of back  Other intervertebral disc degeneration, lumbar region  Chronic midline low back pain, unspecified whether sciatica present     Problem List There are no problems to display for this patient.   Gar Ponto MS, PT 01/07/20 10:51 AM  [facility] [address] [place], [place], [address] Phone: [phone]   Fax:  [phone]  Name: Makayla Duran MRN: [id number] Date of Birth: January 23, 1951

## 2020-01-15 ENCOUNTER — Ambulatory Visit: Payer: Medicare Other

## 2020-01-15 ENCOUNTER — Other Ambulatory Visit: Payer: Self-pay

## 2020-01-15 ENCOUNTER — Other Ambulatory Visit: Payer: Self-pay | Admitting: Internal Medicine

## 2020-01-15 ENCOUNTER — Ambulatory Visit
Admission: RE | Admit: 2020-01-15 | Discharge: 2020-01-15 | Disposition: A | Discharge status: Home / Self Care | Payer: Medicare Other | Source: Ambulatory Visit | Attending: Internal Medicine | Admitting: Internal Medicine

## 2020-01-15 DIAGNOSIS — R928 Other abnormal and inconclusive findings on diagnostic imaging of breast: Secondary | ICD-10-CM

## 2020-01-24 ENCOUNTER — Ambulatory Visit: Payer: Medicare Other | Attending: General Practice | Admitting: Rehabilitative and Restorative Service Providers"

## 2020-01-24 ENCOUNTER — Other Ambulatory Visit: Payer: Self-pay

## 2020-01-24 ENCOUNTER — Encounter: Payer: Self-pay | Admitting: Podiatry

## 2020-01-24 ENCOUNTER — Encounter: Payer: Self-pay | Admitting: Rehabilitative and Restorative Service Providers"

## 2020-01-24 ENCOUNTER — Ambulatory Visit: Payer: Medicare Other | Admitting: Podiatry

## 2020-01-24 VITALS — BP 130/71 | HR 78 | Temp 97.7°F

## 2020-01-24 DIAGNOSIS — L309 Dermatitis, unspecified: Secondary | ICD-10-CM | POA: Diagnosis not present

## 2020-01-24 DIAGNOSIS — M62830 Muscle spasm of back: Secondary | ICD-10-CM | POA: Diagnosis present

## 2020-01-24 DIAGNOSIS — M5136 Other intervertebral disc degeneration, lumbar region: Secondary | ICD-10-CM | POA: Diagnosis present

## 2020-01-24 DIAGNOSIS — G8929 Other chronic pain: Secondary | ICD-10-CM | POA: Diagnosis present

## 2020-01-24 DIAGNOSIS — B351 Tinea unguium: Secondary | ICD-10-CM

## 2020-01-24 DIAGNOSIS — M545 Low back pain, unspecified: Secondary | ICD-10-CM | POA: Insufficient documentation

## 2020-01-24 MED ORDER — TERBINAFINE HCL 250 MG PO TABS: 250.0000 mg | ORAL_TABLET | Freq: Every day | ORAL | 0 refills | Status: DC

## 2020-01-24 NOTE — Therapy (Signed)
[place]akayla Duran, [place], [address] Phone: [phone]   Fax:  [phone]  Physical Therapy Treatment  Patient Details  Name: Makayla Duran MRN: [id number] Date of Birth: [birth year] Referring Provider (PT): Andree Moro, DO   Encounter Date: 01/24/2020   PT End of Session - 01/24/20 1831    Visit Number 2    Number of Visits 13    Date for PT Re-Evaluation 02/16/20    Authorization Type [employer]    Progress Note Due on Visit 10    PT Start Time 0556    PT Stop Time [phone]    PT Time Calculation (min) 48 min    Activity Tolerance Patient tolerated treatment well;No increased pain    Behavior During Therapy [facility] for tasks assessed/performed           Past Medical History:  Diagnosis Date  . Anemia   . Arthritis    neck  . Blood transfusion without reported diagnosis    1990  . Meniere disease 2018  . Scoliosis of thoracolumbar spine   . Syncope   . Vertigo     Past Surgical History:  Procedure Laterality Date  . ABDOMINAL HYSTERECTOMY    . TONSILLECTOMY      There were no vitals filed for this visit.   Subjective Assessment - 01/24/20 1800    Subjective My mattress I am on right now is very uncomfortable. I do my exercises and take a shower in the morning and it helps.    Currently in Pain? Yes    Pain Score 3     Pain Location Back    Pain Orientation Posterior;Lower    Pain Descriptors / Indicators Tightness    Pain Type Chronic pain    Pain Frequency Intermittent    Multiple Pain Sites No                             [facility] Adult PT Treatment/Exercise - 01/24/20 0001      Lumbar Exercises: Seated   Other Seated Lumbar Exercises NuStep bil UEs/LEs level 3 x 5 min with PT verbal cues for stretching      Lumbar Exercises: Supine   Other Supine Lumbar Exercises trunk rotation each side x 5 with 10 sec hold; tilt x 20 with concentration on technique;  tilt with heel slide x 15 unilat bil, tilt with bridge x 15; tilt with SLR x 15; tilt with alternating UE/flex ext x 15 each; tilt with glute set with clam shell x 15; tilt with oblique reach x 15; iso tilt with ball squeeze x 15; tilt with ball squeeze and bridge combo x 15; trunk rotation x 3 with 5 sec holds                  PT Education - 01/24/20 1804    Education Details talked with patient about a firm mattress pad due to complaints of current mattress being too soft and increasing stiffness in AM    Person(s) Educated Patient    Methods Explanation    Comprehension Verbalized understanding            PT Short Term Goals - 01/24/20 1821      PT SHORT TERM GOAL #1   Title Pt will be Ind in an initial HEP    Status On-going      PT SHORT TERM GOAL #2  Title Review FOTO results    Status On-going      PT SHORT TERM GOAL #3   Title Pt will voice understanding of measures to reduce and manage low back pain    Status On-going             PT Long Term Goals - 01/07/20 1036      PT LONG TERM GOAL #1   Title Pt will report greater ease in completing household activities to no difficulty    Baseline A little difficulty    Status New    Target Date 02/16/20      PT LONG TERM GOAL #2   Title Pt' FOTO score will improve to 37% limitation    Baseline 43%    Status New    Target Date 02/16/20      PT LONG TERM GOAL #3   Title Pt will demonstrate increase lumbar flexion mobility to reduce lumbar pain with prolonged positions    Status New    Target Date 02/16/20      PT LONG TERM GOAL #4   Title Pt will report improved low back pain c daily activites with a decreased frequency and pain range 0-3/10    Baseline 0-7/10    Status New    Target Date 02/16/20      PT LONG TERM GOAL #5   Title Pt will be Ind in a final HEP    Status New    Target Date 02/16/20                 Plan - 01/24/20 1816    Clinical Impression Statement Pt presents with  intermittent LBP affected by her mattress and predominantly present during the morning and improves as the day goes on. Pt demos weak core and lumbar musculature and would benefit from PT for strengthening of the above. Advised pt she may be sore tomorrow and to do her exercises as previously given and to take a walk to assist with muscle soreness. she agreed.    Rehab Potential Good    PT Frequency 2x / week    PT Duration 6 weeks    PT Treatment/Interventions ADLs/Self Care Home Management;Cryotherapy;Electrical Stimulation;Iontophoresis 4mg /ml Dexamethasone;Moist Heat;Traction;Ultrasound;Therapeutic exercise;Patient/family education;Manual techniques;Passive range of motion;Dry needling;Taping    PT Next Visit Plan progress lumbar/core strengthening, continue to instruct in posture and body mechanics    PT Home Exercise Plan [id number]    Consulted and Agree with Plan of Care Patient           Patient will benefit from skilled therapeutic intervention in order to improve the following deficits and impairments:  Decreased range of motion, Increased muscle spasms, Decreased activity tolerance, Pain, Improper body mechanics, Decreased strength, Postural dysfunction  Visit Diagnosis: Other intervertebral disc degeneration, lumbar region  Chronic midline low back pain, unspecified whether sciatica present  Muscle spasm of back     Problem List There are no problems to display for this patient.   Myra Rude, PT 01/24/2020, 6:48 PM  [facility] [address] [place], [place], [address] Phone: [phone]   Fax:  [phone]  Name: Makayla Duran MRN: [id number] Date of Birth: 1950/12/13

## 2020-01-26 ENCOUNTER — Other Ambulatory Visit: Payer: Self-pay

## 2020-01-26 ENCOUNTER — Ambulatory Visit: Payer: Medicare Other

## 2020-01-26 DIAGNOSIS — G8929 Other chronic pain: Secondary | ICD-10-CM

## 2020-01-26 DIAGNOSIS — M5136 Other intervertebral disc degeneration, lumbar region: Secondary | ICD-10-CM

## 2020-01-26 DIAGNOSIS — M545 Low back pain, unspecified: Secondary | ICD-10-CM

## 2020-01-26 DIAGNOSIS — M62830 Muscle spasm of back: Secondary | ICD-10-CM

## 2020-01-26 NOTE — Therapy (Signed)
[place], [place], [address] Phone: [phone]   Fax:  [phone]  Physical Therapy Treatment  Patient Details  Name: Makayla Duran MRN: [id number] Date of Birth: 1951/01/14 Referring Provider (PT): Andree Moro, DO   Encounter Date: 01/26/2020   PT End of Session - 01/26/20 1018    Visit Number 3    Number of Visits 13    Date for PT Re-Evaluation 02/16/20    Authorization Type [employer]    Progress Note Due on Visit 10    PT Start Time 0950    PT Stop Time 1034    PT Time Calculation (min) 44 min    Activity Tolerance Patient tolerated treatment well;No increased pain    Behavior During Therapy WFL for tasks assessed/performed           Past Medical History:  Diagnosis Date  . Anemia   . Arthritis    neck  . Blood transfusion without reported diagnosis    1990  . Meniere disease 2018  . Scoliosis of thoracolumbar spine   . Syncope   . Vertigo     Past Surgical History:  Procedure Laterality Date  . ABDOMINAL HYSTERECTOMY    . TONSILLECTOMY      There were no vitals filed for this visit.   Subjective Assessment - 01/26/20 1005    Subjective Pt reports her greatest issue is low back pain and L ant thigh numbness when she wakes up in the AM. pt is staying with her bother currently and the bed is very soft.    Patient Stated Goals Exercise to reduce pain and stress    Currently in Pain? No/denies    Pain Score 5     Pain Location Back    Pain Orientation Posterior;Lower    Pain Descriptors / Indicators Tightness;Aching    Pain Type Chronic pain    Pain Onset Other (comment)   2 years   Pain Frequency Intermittent    Aggravating Factors  Sleeping on a soft bed    Pain Relieving Factors Rest, medications, flexibility exs in AM    Effect of Pain on Daily Activities Decreased activity when hurting                             [facility] Adult PT  Treatment/Exercise - 01/26/20 0001      Exercises   Exercises Lumbar      Lumbar Exercises: Stretches   Active Hamstring Stretch Right;Left;2 reps;20 seconds    Active Hamstring Stretch Limitations seated    Other Lumbar Stretch Exercise Seated forward flexion 3x; 20sec    Other Lumbar Stretch Exercise hip flexor stretch in standing , 2 chairs 2x, 20 sec, each LE      Lumbar Exercises: Standing   Row Strengthening;Both;15 reps    Theraband Level (Row) Level 2 (Red)    Row Limitations 2 sets    Shoulder Extension Strengthening    Theraband Level (Shoulder Extension) Level 2 (Red)    Shoulder Extension Limitations 2 sets      Lumbar Exercises: Supine   Pelvic Tilt 10 reps    Pelvic Tilt Limitations 3 sec    Dead Bug 10 reps    Dead Bug Limitations 3 sets; Legs only    Other Supine Lumbar Exercises mini- crunch c low angle arm reaches 10x, 3 sec  PT Education - 01/26/20 1537    Education Details HEP- strengthening exs for back and hip flexor stretch. Positioning for sleeping for better support and reduce discomfort.    Person(s) Educated Patient    Methods Explanation;Demonstration;Tactile cues;Verbal cues;Handout    Comprehension Verbalized understanding;Returned demonstration;Verbal cues required;Tactile cues required;Need further instruction            PT Short Term Goals - 01/24/20 1821      PT SHORT TERM GOAL #1   Title Pt will be Ind in an initial HEP    Status On-going      PT SHORT TERM GOAL #2   Title Review FOTO results    Status On-going      PT SHORT TERM GOAL #3   Title Pt will voice understanding of measures to reduce and manage low back pain    Status On-going             PT Long Term Goals - 01/07/20 1036      PT LONG TERM GOAL #1   Title Pt will report greater ease in completing household activities to no difficulty    Baseline A little difficulty    Status New    Target Date 02/16/20      PT LONG TERM GOAL #2    Title Pt' FOTO score will improve to 37% limitation    Baseline 43%    Status New    Target Date 02/16/20      PT LONG TERM GOAL #3   Title Pt will demonstrate increase lumbar flexion mobility to reduce lumbar pain with prolonged positions    Status New    Target Date 02/16/20      PT LONG TERM GOAL #4   Title Pt will report improved low back pain c daily activites with a decreased frequency and pain range 0-3/10    Baseline 0-7/10    Status New    Target Date 02/16/20      PT LONG TERM GOAL #5   Title Pt will be Ind in a final HEP    Status New    Target Date 02/16/20                 Plan - 01/26/20 1540    Clinical Impression Statement PT was provided for low back flexion, hip flexor, and hamstring stretches; abd. and low back strengthening. Pt was also instructed in positions for comfort while sleeping. Sleeping on a soft mattress while currently living with her bother is a significant source of her low back pain.    Personal Factors and Comorbidities Comorbidity 2    Comorbidities arhtritis, thoracolumbar scoliosis    Examination-Activity Limitations Sit;Squat;Stand;Lift    Stability/Clinical Decision Making Stable/Uncomplicated    Clinical Decision Making Low    Rehab Potential Good    PT Frequency 2x / week    PT Duration 6 weeks    PT Treatment/Interventions ADLs/Self Care Home Management;Cryotherapy;Electrical Stimulation;Iontophoresis 4mg /ml Dexamethasone;Moist Heat;Traction;Ultrasound;Therapeutic exercise;Patient/family education;Manual techniques;Passive range of motion;Dry needling;Taping    PT Next Visit Plan progress lumbar/core strengthening, continue to instruct in posture and body mechanics, assess response to recommended sleeping positions    PT Home Exercise Plan [id number]    Consulted and Agree with Plan of Care Patient           Patient will benefit from skilled therapeutic intervention in order to improve the following deficits and impairments:   Decreased range of motion, Increased muscle spasms, Decreased activity tolerance, Pain, Improper  body mechanics, Decreased strength, Postural dysfunction  Visit Diagnosis: Other intervertebral disc degeneration, lumbar region  Chronic midline low back pain, unspecified whether sciatica present  Muscle spasm of back     Problem List There are no problems to display for this patient.   Gar Ponto MS, PT 01/26/20 3:55 PM  [facility] [address] [place], [place], [address] Phone: [phone]   Fax:  [phone]  Name: Brittini Brubeck MRN: [id number] Date of Birth: [birth year]

## 2020-01-26 NOTE — Patient Instructions (Addendum)
  2 chair standing hip flexor stretch. Not pictured

## 2020-01-28 NOTE — Progress Notes (Signed)
Subjective:   Patient ID: Makayla Duran, female   DOB: 69 y.o.   MRN: [id number]   HPI Patient presents with 2 separate problems with 1 being thickened discolored nailbeds especially the big toes and also odor that is produced with dry skin formation.  Patient does smoke occasionally and is reasonably active   Review of Systems  All other systems reviewed and are negative.       Objective:  Physical Exam Vitals and nursing note reviewed.  Constitutional:      Appearance: She is well-developed.  Pulmonary:     Effort: Pulmonary effort is normal.  Musculoskeletal:        General: Normal range of motion.  Skin:    General: Skin is warm.  Neurological:     Mental Status: She is alert.     Neurovascular status was found to be intact muscle strength was found to be adequate range of motion adequate.  Patient does have discoloration of nailbeds especially around the hallux medial lateral sides that is more in the distal portion of the beds.  There is good digital perfusion patient is well oriented and I also noted that there is some skin irritation that is localized with no acute odor that I was able to diagnose at the current time     Assessment:  Probability for low-grade mycotic nail infection along with skin which may be fungal infection or just possibly excessive sweating     Plan:  H&P reviewed conditions and I have recommended soaks for the patient along with topical medications reviewed changing socks frequently and shoe gear changes.  We could consider laser oral at 1 point in future and I did educate her on these types of treatments

## 2020-01-29 ENCOUNTER — Other Ambulatory Visit: Payer: Self-pay

## 2020-01-29 ENCOUNTER — Ambulatory Visit: Payer: Medicare Other

## 2020-01-29 DIAGNOSIS — M62830 Muscle spasm of back: Secondary | ICD-10-CM

## 2020-01-29 DIAGNOSIS — M5136 Other intervertebral disc degeneration, lumbar region: Secondary | ICD-10-CM | POA: Diagnosis not present

## 2020-01-29 DIAGNOSIS — G8929 Other chronic pain: Secondary | ICD-10-CM

## 2020-01-29 DIAGNOSIS — M545 Low back pain, unspecified: Secondary | ICD-10-CM

## 2020-01-29 NOTE — Therapy (Signed)
[place], [place], [address] Phone: [phone]   Fax:  [phone]  Physical Therapy Treatment  Patient Details  Name: Makayla Duran MRN: [id number] Date of Birth: [birth year] Referring Provider (PT): Andree Moro, DO   Encounter Date: 01/29/2020    Past Medical History:  Diagnosis Date   Anemia    Arthritis    neck   Blood transfusion without reported diagnosis    1990   Meniere disease 2018   Scoliosis of thoracolumbar spine    Syncope    Vertigo     Past Surgical History:  Procedure Laterality Date   ABDOMINAL HYSTERECTOMY     TONSILLECTOMY      There were no vitals filed for this visit.                      [facility] Adult PT Treatment/Exercise - 01/29/20 0001      Lumbar Exercises: Stretches   Active Hamstring Stretch Right;Left;2 reps;20 seconds    Active Hamstring Stretch Limitations supine    Other Lumbar Stretch Exercise Seated forward flexion 3x; 20sec      Lumbar Exercises: Standing   Row Strengthening;Both;15 reps    Theraband Level (Row) Level 2 (Red)    Row Limitations 2 sets    Shoulder Extension Strengthening    Theraband Level (Shoulder Extension) Level 2 (Red)    Shoulder Extension Limitations 2 sets      Lumbar Exercises: Supine   Pelvic Tilt 15 reps;5 seconds    Dead Bug 10 reps    Dead Bug Limitations 2 sets    Bridge with [employer] 15 reps;3 seconds    Bridge with [employer] Limitations c PPT    Other Supine Lumbar Exercises tabletop holds 5x15"    + heel squeeze   Other Supine Lumbar Exercises Pilates 100 in tabletop                    PT Short Term Goals - 01/24/20 1821      PT SHORT TERM GOAL #1   Title Pt will be Ind in an initial HEP    Status On-going      PT SHORT TERM GOAL #2   Title Review FOTO results    Status On-going      PT SHORT TERM GOAL #3   Title Pt will voice understanding of measures to  reduce and manage low back pain    Status On-going             PT Long Term Goals - 01/07/20 1036      PT LONG TERM GOAL #1   Title Pt will report greater ease in completing household activities to no difficulty    Baseline A little difficulty    Status New    Target Date 02/16/20      PT LONG TERM GOAL #2   Title Pt' FOTO score will improve to 37% limitation    Baseline 43%    Status New    Target Date 02/16/20      PT LONG TERM GOAL #3   Title Pt will demonstrate increase lumbar flexion mobility to reduce lumbar pain with prolonged positions    Status New    Target Date 02/16/20      PT LONG TERM GOAL #4   Title Pt will report improved low back pain c daily activites with a decreased frequency and pain range 0-3/10  Baseline 0-7/10    Status New    Target Date 02/16/20      PT LONG TERM GOAL #5   Title Pt will be Ind in a final HEP    Status New    Target Date 02/16/20                 Plan - 01/29/20 1919    Clinical Impression Statement Pt had excellent tolerance to core progressions, performing full dead bug and 100 today. She was unable to perform plank with appropriate form on forearms or palms from elevated surface, so held for now. Max cueing for proper form with rowing and pulldowns, adding ER to iso row with pt reporting sig ER fatigue.    Personal Factors and Comorbidities Comorbidity 2    Comorbidities arhtritis, thoracolumbar scoliosis    Examination-Activity Limitations Sit;Squat;Stand;Lift    Stability/Clinical Decision Making Stable/Uncomplicated    Rehab Potential Good    PT Frequency 2x / week    PT Duration 6 weeks    PT Treatment/Interventions ADLs/Self Care Home Management;Cryotherapy;Electrical Stimulation;Iontophoresis 4mg /ml Dexamethasone;Moist Heat;Traction;Ultrasound;Therapeutic exercise;Patient/family education;Manual techniques;Passive range of motion;Dry needling;Taping    PT Next Visit Plan progress lumbar/core strengthening,  continue to instruct in posture and body mechanics, assess response to recommended sleeping positions    PT Home Exercise Plan [id number]    Consulted and Agree with Plan of Care Patient           Patient will benefit from skilled therapeutic intervention in order to improve the following deficits and impairments:  Decreased range of motion, Increased muscle spasms, Decreased activity tolerance, Pain, Improper body mechanics, Decreased strength, Postural dysfunction  Visit Diagnosis: Other intervertebral disc degeneration, lumbar region  Chronic midline low back pain, unspecified whether sciatica present  Muscle spasm of back     Problem List There are no problems to display for this patient.   Izell [place], PT, DPT 01/29/2020, 7:21 PM  [facility] [address] [place], [place], [address] Phone: [phone]   Fax:  [phone]  Name: Lessly Stigler MRN: [id number] Date of Birth: 05/02/1950

## 2020-01-31 ENCOUNTER — Other Ambulatory Visit: Payer: Self-pay

## 2020-01-31 ENCOUNTER — Encounter: Payer: Self-pay | Admitting: Rehabilitative and Restorative Service Providers"

## 2020-01-31 ENCOUNTER — Ambulatory Visit: Payer: Medicare Other | Admitting: Rehabilitative and Restorative Service Providers"

## 2020-01-31 DIAGNOSIS — M5136 Other intervertebral disc degeneration, lumbar region: Secondary | ICD-10-CM | POA: Diagnosis not present

## 2020-01-31 DIAGNOSIS — M62830 Muscle spasm of back: Secondary | ICD-10-CM

## 2020-01-31 DIAGNOSIS — M545 Low back pain, unspecified: Secondary | ICD-10-CM

## 2020-01-31 DIAGNOSIS — G8929 Other chronic pain: Secondary | ICD-10-CM

## 2020-01-31 NOTE — Therapy (Signed)
[place], [place], [address] Phone: [phone]   Fax:  [phone]  Physical Therapy Treatment  Patient Details  Name: Makayla Duran MRN: [id number] Date of Birth: 10-17-1950 Referring Provider (PT): Andree Moro, DO   Encounter Date: 01/31/2020   PT End of Session - 01/31/20 1726    Visit Number 5    Number of Visits 13    Date for PT Re-Evaluation 02/16/20    Authorization Type [employer]    Progress Note Due on Visit 10    PT Start Time 0520    PT Stop Time 0604    PT Time Calculation (min) 44 min    Activity Tolerance Patient tolerated treatment well;No increased pain    Behavior During Therapy WFL for tasks assessed/performed           Past Medical History:  Diagnosis Date  . Anemia   . Arthritis    neck  . Blood transfusion without reported diagnosis    1990  . Meniere disease 2018  . Scoliosis of thoracolumbar spine   . Syncope   . Vertigo     Past Surgical History:  Procedure Laterality Date  . ABDOMINAL HYSTERECTOMY    . TONSILLECTOMY      There were no vitals filed for this visit.   Subjective Assessment - 01/31/20 1719    Subjective Doing great.    Currently in Pain? No/denies                             [facility] Adult PT Treatment/Exercise - 01/31/20 0001      Lumbar Exercises: Standing   Other Standing Lumbar Exercises Freemotion 7 lbs [place] dead lift x 10 with one handle with both hands holding with PT mod verbal cues for technique      Lumbar Exercises: Supine   Other Supine Lumbar Exercises tilt x 20; tilt with bridge x 20; tilt with red band chest pull x 20; tilt with red band hip pull x 20; oblique reaches x 20 unilat bil; ball squeeze with tilt x 20 with 5 sec hold      Lumbar Exercises: Prone   Other Prone Lumbar Exercises prone heel squeeze x 20 with 5 sec hold; donkey kick with glute set x 20; hip ext unilat bil x 20;        Lumbar Exercises: Quadruped   Other Quadruped Lumbar Exercises prayer stretch in neutral 2x30 sec                    PT Short Term Goals - 01/31/20 1730      PT SHORT TERM GOAL #1   Title Pt will be Ind in an initial HEP    Status On-going      PT SHORT TERM GOAL #2   Title Review FOTO results    Status On-going      PT SHORT TERM GOAL #3   Title Pt will voice understanding of measures to reduce and manage low back pain    Status On-going    Target Date 01/28/20             PT Long Term Goals - 01/07/20 1036      PT LONG TERM GOAL #1   Title Pt will report greater ease in completing household activities to no difficulty    Baseline A little difficulty    Status New  Target Date 02/16/20      PT LONG TERM GOAL #2   Title Pt' FOTO score will improve to 37% limitation    Baseline 43%    Status New    Target Date 02/16/20      PT LONG TERM GOAL #3   Title Pt will demonstrate increase lumbar flexion mobility to reduce lumbar pain with prolonged positions    Status New    Target Date 02/16/20      PT LONG TERM GOAL #4   Title Pt will report improved low back pain c daily activites with a decreased frequency and pain range 0-3/10    Baseline 0-7/10    Status New    Target Date 02/16/20      PT LONG TERM GOAL #5   Title Pt will be Ind in a final HEP    Status New    Target Date 02/16/20                 Plan - 01/31/20 1726    Clinical Impression Statement Pt reports improvement in LBP; Pt progressing well with core and lumbar strengthening and would benefit from further PT for further lumbar/core strengthening to assist with LBP management.    PT Frequency 2x / week    PT Duration 6 weeks    PT Treatment/Interventions ADLs/Self Care Home Management;Cryotherapy;Electrical Stimulation;Iontophoresis 4mg /ml Dexamethasone;Moist Heat;Traction;Ultrasound;Therapeutic exercise;Patient/family education;Manual techniques;Passive range of motion;Dry  needling;Taping    PT Next Visit Plan progress lumbar/core strengthening, continue to instruct in posture and body mechanics, assess response to recommended sleeping positions    Consulted and Agree with Plan of Care Patient           Patient will benefit from skilled therapeutic intervention in order to improve the following deficits and impairments:  Decreased range of motion, Increased muscle spasms, Decreased activity tolerance, Pain, Improper body mechanics, Decreased strength, Postural dysfunction  Visit Diagnosis: Other intervertebral disc degeneration, lumbar region  Chronic midline low back pain, unspecified whether sciatica present  Muscle spasm of back     Problem List There are no problems to display for this patient.   Myra Rude, PT, DPT 01/31/2020, 6:06 PM  [facility] [address] [place], [place], [address] Phone: [phone]   Fax:  [phone]  Name: Keiarra Charon MRN: [id number] Date of Birth: Oct 28, 1950

## 2020-02-05 ENCOUNTER — Other Ambulatory Visit: Payer: Self-pay

## 2020-02-05 ENCOUNTER — Ambulatory Visit: Payer: Medicare Other

## 2020-02-05 DIAGNOSIS — M5136 Other intervertebral disc degeneration, lumbar region: Secondary | ICD-10-CM | POA: Diagnosis not present

## 2020-02-05 DIAGNOSIS — M62830 Muscle spasm of back: Secondary | ICD-10-CM

## 2020-02-05 DIAGNOSIS — G8929 Other chronic pain: Secondary | ICD-10-CM

## 2020-02-05 NOTE — Therapy (Signed)
[place], [place], [address] Phone: [phone]   Fax:  [phone]  Physical Therapy Treatment  Patient Details  Name: Makayla Duran MRN: [id number] Date of Birth: [birth year] Referring Provider (PT): Andree Moro, DO   Encounter Date: 02/05/2020   PT End of Session - 02/05/20 1839    Visit Number 6    Number of Visits 13    Date for PT Re-Evaluation 02/16/20    Authorization Type [employer]    Progress Note Due on Visit 10    PT Start Time 9390    PT Stop Time 1920    PT Time Calculation (min) 46 min    Activity Tolerance Patient tolerated treatment well;No increased pain    Behavior During Therapy WFL for tasks assessed/performed           Past Medical History:  Diagnosis Date  . Anemia   . Arthritis    neck  . Blood transfusion without reported diagnosis    1990  . Meniere disease 2018  . Scoliosis of thoracolumbar spine   . Syncope   . Vertigo     Past Surgical History:  Procedure Laterality Date  . ABDOMINAL HYSTERECTOMY    . TONSILLECTOMY      There were no vitals filed for this visit.   Subjective Assessment - 02/05/20 1936    Subjective Pt reports she is continuing to do well. pt is sleeping better at night    Currently in Pain? No/denies    Pain Score 0-No pain    Pain Location Back                             [facility] Adult PT Treatment/Exercise - 02/05/20 0001      Exercises   Exercises Lumbar;Knee/Hip      Lumbar Exercises: Standing   Other Standing Lumbar Exercises Palloff press       Lumbar Exercises: Supine   Pelvic Tilt 15 reps;5 seconds    Dead Bug 20 reps    Dead Bug Limitations Alt arms and legs with ball    Other Supine Lumbar Exercises Pilates 100 in tabletop      Lumbar Exercises: Quadruped   Straight Leg Raise 10 reps;2 seconds    Straight Leg Raises Limitations each LE    Opposite Arm/Leg Raise Right arm/Left  leg;Left arm/Right leg;10 reps;2 seconds      Knee/Hip Exercises: Seated   Sit to Sand 2 sets;10 reps                  PT Education - 02/05/20 1938    Education Details HEP    Person(s) Educated Patient    Methods Explanation;Demonstration;Tactile cues;Verbal cues;Handout    Comprehension Verbalized understanding;Returned demonstration            PT Short Term Goals - 02/05/20 1942      PT SHORT TERM GOAL #1   Title Pt will be Ind in an initial HEP. Achieved    Status Achieved    Target Date 02/05/20      PT SHORT TERM GOAL #2   Title Review FOTO results. Achieved    Status Achieved    Target Date 02/05/20      PT SHORT TERM GOAL #3   Title Pt will voice understanding of measures to reduce and manage low back pain. Posture, sleeping positions, ther ex    Status Achieved  Target Date 02/05/20             PT Long Term Goals - 02/05/20 1944      PT LONG TERM GOAL #2   Title Pt' FOTO score will improve to 37% limitation. Achieved 31% limitation    Baseline 43%    Status Achieved    Target Date 02/05/20                 Plan - 02/05/20 1938    Clinical Impression Statement FOTO retaken and pt scored better than the predicted value. Pt's reports indicate continued improvement including sleeping better at night. PT was completed for abdominal strengthening as well strengthening for the back.    Personal Factors and Comorbidities Comorbidity 2    Comorbidities arhtritis, thoracolumbar scoliosis    Examination-Activity Limitations Sit;Squat;Stand;Lift    Stability/Clinical Decision Making Stable/Uncomplicated    Clinical Decision Making Low    Rehab Potential Good    PT Frequency 2x / week    PT Duration 6 weeks    PT Treatment/Interventions ADLs/Self Care Home Management;Cryotherapy;Electrical Stimulation;Iontophoresis 4mg /ml Dexamethasone;Moist Heat;Traction;Ultrasound;Therapeutic exercise;Patient/family education;Manual techniques;Passive range of  motion;Dry needling;Taping    PT Next Visit Plan continue to instruct in posture and body mechanics    PT Home Exercise Plan [id number]    Consulted and Agree with Plan of Care Patient           Patient will benefit from skilled therapeutic intervention in order to improve the following deficits and impairments:  Decreased range of motion, Increased muscle spasms, Decreased activity tolerance, Pain, Improper body mechanics, Decreased strength, Postural dysfunction  Visit Diagnosis: Other intervertebral disc degeneration, lumbar region  Chronic midline low back pain, unspecified whether sciatica present  Muscle spasm of back     Problem List There are no problems to display for this patient.   Gar Ponto MS, PT 02/05/20 7:48 PM  [facility] [address] [place], [place], [address] Phone: [phone]   Fax:  [phone]  Name: Makayla Duran MRN: [id number] Date of Birth: 1950/10/16

## 2020-02-09 ENCOUNTER — Other Ambulatory Visit: Payer: Self-pay

## 2020-02-09 ENCOUNTER — Ambulatory Visit: Payer: Medicare Other | Admitting: Physical Therapy

## 2020-02-09 ENCOUNTER — Encounter: Payer: Self-pay | Admitting: Physical Therapy

## 2020-02-09 DIAGNOSIS — M545 Low back pain, unspecified: Secondary | ICD-10-CM

## 2020-02-09 DIAGNOSIS — M62830 Muscle spasm of back: Secondary | ICD-10-CM

## 2020-02-09 DIAGNOSIS — M5136 Other intervertebral disc degeneration, lumbar region: Secondary | ICD-10-CM

## 2020-02-09 DIAGNOSIS — G8929 Other chronic pain: Secondary | ICD-10-CM

## 2020-02-09 NOTE — Therapy (Signed)
[place], [place], [address] Phone: [phone]   Fax:  [phone]  Physical Therapy Treatment  Patient Details  Name: Makayla Duran MRN: [id number] Date of Birth: 19-Aug-1950 Referring Provider (PT): Andree Moro, DO   Encounter Date: 02/09/2020   PT End of Session - 02/09/20 1036    Visit Number 7    Number of Visits 13    Date for PT Re-Evaluation 02/16/20    Authorization Type [employer]    Progress Note Due on Visit 10    PT Start Time 1030    PT Stop Time 1110    PT Time Calculation (min) 40 min    Activity Tolerance Patient tolerated treatment well;No increased pain    Behavior During Therapy WFL for tasks assessed/performed           Past Medical History:  Diagnosis Date  . Anemia   . Arthritis    neck  . Blood transfusion without reported diagnosis    1990  . Meniere disease 2018  . Scoliosis of thoracolumbar spine   . Syncope   . Vertigo     Past Surgical History:  Procedure Laterality Date  . ABDOMINAL HYSTERECTOMY    . TONSILLECTOMY      There were no vitals filed for this visit.   Subjective Assessment - 02/09/20 1036    Subjective Patient reports she is doing well.    Patient Stated Goals Exercise to reduce pain and stress    Currently in Pain? No/denies              [facility] PT Assessment - 02/09/20 0001      AROM   Overall AROM Comments Patient exhibit lumbar motion grossly WFL and non-painful      Strength   Overall Strength Comments Core and hip strength grossly 4/5 MMT                         [facility] Adult PT Treatment/Exercise - 02/09/20 0001      Exercises   Exercises Lumbar;Knee/Hip      Lumbar Exercises: Stretches   Lower Trunk Rotation Limitations 10 x 5 sec each    Piriformis Stretch 2 reps;30 seconds    Piriformis Stretch Limitations supine figure-4    Other Lumbar Stretch Exercise Child's pose stretch 2 x 30  sec      Lumbar Exercises: Aerobic   Nustep L5 x 5 min with UE/LE   occasional rest break due to left leg feeling tired     Lumbar Exercises: Standing   Lifting 10 reps   2 sets   Lifting Weights (lbs) 25    Lifting Limitations Dead lift from 6" box      Lumbar Exercises: Supine   Dead Bug 10 reps   2 sets   Dead Bug Limitations with physioball    Bridge 10 reps;3 seconds   2 sets   Bridge Limitations cued for PPT      Lumbar Exercises: Sidelying   Hip Abduction 10 reps   2 sets     Lumbar Exercises: Quadruped   Madcat/Old Horse 10 reps   2 sets   Opposite Arm/Leg Raise 10 reps;3 seconds   2 sets                 PT Education - 02/09/20 1036    Education Details HEP    Person(s) Educated Patient    Methods Explanation  Comprehension Verbalized understanding;Need further instruction            PT Short Term Goals - 02/05/20 1942      PT SHORT TERM GOAL #1   Title Pt will be Ind in an initial HEP. Achieved    Status Achieved    Target Date 02/05/20      PT SHORT TERM GOAL #2   Title Review FOTO results. Achieved    Status Achieved    Target Date 02/05/20      PT SHORT TERM GOAL #3   Title Pt will voice understanding of measures to reduce and manage low back pain. Posture, sleeping positions, ther ex    Status Achieved    Target Date 02/05/20             PT Long Term Goals - 02/05/20 1944      PT LONG TERM GOAL #2   Title Pt' FOTO score will improve to 37% limitation. Achieved 31% limitation    Baseline 43%    Status Achieved    Target Date 02/05/20                 Plan - 02/09/20 1037    Clinical Impression Statement Patient tolerated therapy well with no adverse effects. Continued progression of strengthening this visit with good tolerance. Patient did require occasional cue for exercise and lifting technique, and maintaining proper core activation. She did not left leg got tired with therapy but denied pain. She would benefit from  continued skilled PT to progress strength to reduce pain and maximize functional level.    PT Treatment/Interventions ADLs/Self Care Home Management;Cryotherapy;Electrical Stimulation;Iontophoresis 4mg /ml Dexamethasone;Moist Heat;Traction;Ultrasound;Therapeutic exercise;Patient/family education;Manual techniques;Passive range of motion;Dry needling;Taping    PT Next Visit Plan continue to instruct in posture and body mechanics    PT Home Exercise Plan [id number]    Consulted and Agree with Plan of Care Patient           Patient will benefit from skilled therapeutic intervention in order to improve the following deficits and impairments:  Decreased range of motion, Increased muscle spasms, Decreased activity tolerance, Pain, Improper body mechanics, Decreased strength, Postural dysfunction  Visit Diagnosis: Chronic midline low back pain, unspecified whether sciatica present  Muscle spasm of back  Other intervertebral disc degeneration, lumbar region     Problem List There are no problems to display for this patient.   Hilda Blades, PT, DPT, LAT, ATC 02/09/20  11:15 AM Phone: [phone] Fax: [facility] [address] [place], [place], [address] Phone: [phone]   Fax:  [phone]  Name: Makayla Duran MRN: [id number] Date of Birth: 05-01-1950

## 2020-02-12 ENCOUNTER — Other Ambulatory Visit: Payer: Self-pay

## 2020-02-12 ENCOUNTER — Ambulatory Visit: Payer: Medicare Other

## 2020-02-12 DIAGNOSIS — G8929 Other chronic pain: Secondary | ICD-10-CM

## 2020-02-12 DIAGNOSIS — M5136 Other intervertebral disc degeneration, lumbar region: Secondary | ICD-10-CM | POA: Diagnosis not present

## 2020-02-12 DIAGNOSIS — M62830 Muscle spasm of back: Secondary | ICD-10-CM

## 2020-02-12 NOTE — Therapy (Signed)
[place], [place], [address] Phone: [phone]   Fax:  [phone]  Physical Therapy Treatment  Patient Details  Name: Makayla Duran MRN: [id number] Date of Birth: [birth year] Referring Provider (PT): Andree Moro, DO   Encounter Date: 02/12/2020   PT End of Session - 02/12/20 1821    Visit Number 7    Number of Visits 13    Date for PT Re-Evaluation 02/16/20    Authorization Type [employer]    Progress Note Due on Visit 10    PT Start Time 4235    PT Stop Time 1840    PT Time Calculation (min) 44 min    Activity Tolerance Patient tolerated treatment well;No increased pain    Behavior During Therapy WFL for tasks assessed/performed           Past Medical History:  Diagnosis Date  . Anemia   . Arthritis    neck  . Blood transfusion without reported diagnosis    1990  . Meniere disease 2018  . Scoliosis of thoracolumbar spine   . Syncope   . Vertigo     Past Surgical History:  Procedure Laterality Date  . ABDOMINAL HYSTERECTOMY    . TONSILLECTOMY      There were no vitals filed for this visit.   Subjective Assessment - 02/12/20 1813    Subjective Pt reports a tingling sensation of her L thigh at work after sitting, but it went away after standing and walking    Currently in Pain? No/denies    Pain Score 0-No pain    Pain Orientation Posterior;Lower    Pain Onset Other (comment)    Pain Frequency Occasional                             [facility] Adult PT Treatment/Exercise - 02/12/20 0001      Exercises   Exercises Lumbar;Knee/Hip      Lumbar Exercises: Stretches   Active Hamstring Stretch Right;Left;2 reps;20 seconds    Active Hamstring Stretch Limitations seated    Lower Trunk Rotation Limitations 10 x 5 sec each    Piriformis Stretch 2 reps;30 seconds    Piriformis Stretch Limitations supine figure-4    Other Lumbar Stretch Exercise Seated  forward flexion 3x; 20sec    Other Lumbar Stretch Exercise Child's pose stretch 2 x 30 sec      Lumbar Exercises: Aerobic   Nustep L5 x 5 min with UE/LE   occasional rest break due to left leg feeling tired     Lumbar Exercises: Standing   Lifting 10 reps   Large awkward box; corner lifts   Lifting Weights (lbs) 5 lbs    Other Standing Lumbar Exercises Single leg hinge, 5lbs, 10x each       Lumbar Exercises: Quadruped   Straight Leg Raise 10 reps;2 seconds    Opposite Arm/Leg Raise 10 reps;3 seconds   2 sets                   PT Short Term Goals - 02/05/20 1942      PT SHORT TERM GOAL #1   Title Pt will be Ind in an initial HEP. Achieved    Status Achieved    Target Date 02/05/20      PT SHORT TERM GOAL #2   Title Review FOTO results. Achieved    Status Achieved    Target Date  02/05/20      PT SHORT TERM GOAL #3   Title Pt will voice understanding of measures to reduce and manage low back pain. Posture, sleeping positions, ther ex    Status Achieved    Target Date 02/05/20             PT Long Term Goals - 02/05/20 1944      PT LONG TERM GOAL #2   Title Pt' FOTO score will improve to 37% limitation. Achieved 31% limitation    Baseline 43%    Status Achieved    Target Date 02/05/20                 Plan - 02/12/20 1823    Clinical Impression Statement Pt continues to be doing well with PT intervention with her low back pain being resolved. PT continued with flexibility of back and LEs and strengthening of the back, abs, and LEs. Education re; Designer, fashion/clothing with lifting was continued c 1 hnaded lifts and technique for lifting awkward boxes. Pt returned demonstration.    Personal Factors and Comorbidities Comorbidity 2    Comorbidities arhtritis, thoracolumbar scoliosis    Examination-Activity Limitations Sit;Squat;Stand;Lift    Stability/Clinical Decision Making Stable/Uncomplicated    Clinical Decision Making Low    Rehab Potential Good      PT Frequency 2x / week    PT Duration 6 weeks    PT Treatment/Interventions ADLs/Self Care Home Management;Cryotherapy;Electrical Stimulation;Iontophoresis 4mg /ml Dexamethasone;Moist Heat;Traction;Ultrasound;Therapeutic exercise;Patient/family education;Manual techniques;Passive range of motion;Dry needling;Taping    PT Next Visit Plan DC    PT Home Exercise Plan [id number]    Consulted and Agree with Plan of Care Patient           Patient will benefit from skilled therapeutic intervention in order to improve the following deficits and impairments:  Decreased range of motion, Increased muscle spasms, Decreased activity tolerance, Pain, Improper body mechanics, Decreased strength, Postural dysfunction  Visit Diagnosis: Chronic midline low back pain, unspecified whether sciatica present  Muscle spasm of back  Other intervertebral disc degeneration, lumbar region     Problem List There are no problems to display for this patient.   Gar Ponto MS, PT 02/12/20 7:52 PM  [facility] [address] [place], [place], [address] Phone: [phone]   Fax:  [phone]  Name: Makayla Duran MRN: [id number] Date of Birth: 07/10/1950

## 2020-02-14 ENCOUNTER — Ambulatory Visit: Payer: Medicare Other

## 2020-02-19 ENCOUNTER — Other Ambulatory Visit: Payer: Self-pay

## 2020-02-19 ENCOUNTER — Encounter: Payer: Self-pay | Admitting: Physical Therapy

## 2020-02-19 ENCOUNTER — Ambulatory Visit: Payer: Medicare Other | Attending: General Practice | Admitting: Physical Therapy

## 2020-02-19 DIAGNOSIS — G8929 Other chronic pain: Secondary | ICD-10-CM | POA: Insufficient documentation

## 2020-02-19 DIAGNOSIS — M62830 Muscle spasm of back: Secondary | ICD-10-CM

## 2020-02-19 DIAGNOSIS — M545 Low back pain, unspecified: Secondary | ICD-10-CM | POA: Insufficient documentation

## 2020-02-19 DIAGNOSIS — M5136 Other intervertebral disc degeneration, lumbar region: Secondary | ICD-10-CM | POA: Insufficient documentation

## 2020-02-19 NOTE — Patient Instructions (Signed)
Access Code: [email or web address] URL: [email or web address] Date: 02/19/2020 Prepared by: Hilda Blades  Exercises Supine Double Knee to Chest - 1 x daily - 7 x weekly - 3 reps - 20-30 seconds hold Seated Flexion Stretch - 1 x daily - 7 x weekly - 3 reps - 20-30 seconds hold Child's Pose with Sidebending - 1 x daily - 7 x weekly - 3 reps - 20-30 seconds hold Supine Lower Trunk Rotation - 1 x daily - 7 x weekly - 5 reps - 10 seconds hold Supine Posterior Pelvic Tilt - 1 x daily - 4-5 x weekly - 1 sets - 10-15 reps - 3 hold Marching Bridge - 1 x daily - 4-5 x weekly - 2 sets - 5-10 reps Supine 90/90 Alternating Heel Touches with Posterior Pelvic Tilt - 1 x daily - 4-5 x weekly - 3 sets - 10 reps Dead Bug - 1 x daily - 4-5 x weekly - 3 sets - 10 reps Supine Dead Bug with Leg Extension - 1 x daily - 4-5 x weekly - 3 sets - 10 reps Curl Up with Reach - 1 x daily - 4-5 x weekly - 2 sets - 10 reps - 3 seconds hold Bird Dog - 1 x daily - 7 x weekly - 2 sets - 10-15 reps - 3 seconds hold Shoulder extension with resistance - Neutral - 1 x daily - 4-5 x weekly - 2 sets - 15 reps - 3 seconds hold Scapular Retraction with Resistance - 1 x daily - 4-5 x weekly - 2 sets - 15 reps - 3 seconds hold Sit to Stand without Arm Support - 1 x daily - 4-5 x weekly - 2 sets - 10-15 reps Standing Anti-Rotation Press with Anchored Resistance - 1 x daily - 4-5 x weekly - 2 sets - 10-15 reps - 3 seconds hold

## 2020-02-19 NOTE — Therapy (Signed)
[place], [place], [address] Phone: [phone]   Fax:  [phone]  Physical Therapy Treatment / Discharge  Patient Details  Name: Makayla Duran MRN: [id number] Date of Birth: [birth year] Referring Provider (PT): Andree Moro, DO   Encounter Date: 02/19/2020   PT End of Session - 02/19/20 1709    Visit Number 9    Number of Visits 13    Date for PT Re-Evaluation 02/19/20    Authorization Type [employer]    Progress Note Due on Visit 10    PT Start Time 1700    PT Stop Time 1745    PT Time Calculation (min) 45 min    Activity Tolerance Patient tolerated treatment well    Behavior During Therapy [facility] for tasks assessed/performed           Past Medical History:  Diagnosis Date  . Anemia   . Arthritis    neck  . Blood transfusion without reported diagnosis    1990  . Meniere disease 2018  . Scoliosis of thoracolumbar spine   . Syncope   . Vertigo     Past Surgical History:  Procedure Laterality Date  . ABDOMINAL HYSTERECTOMY    . TONSILLECTOMY      There were no vitals filed for this visit.   Subjective Assessment - 02/19/20 1706    Subjective Patient notes a little more discomfort in her lower back today, she did do some moving of heavier objects at work.    Patient Stated Goals Exercise to reduce pain and stress    Currently in Pain? Yes    Pain Score 2     Pain Location Back    Pain Orientation Lower    Pain Descriptors / Indicators Sore    Pain Type Chronic pain    Pain Onset More than a month ago    Pain Frequency Intermittent              [facility] PT Assessment - 02/19/20 0001      Assessment   Medical Diagnosis Other intervertebral disc degeneration, lumbar region; Sciatica, unspecified side    Referring Provider (PT) Andree Moro, DO      Precautions   Precautions None      Restrictions   Weight Bearing Restrictions No      Balance Screen   Has  the patient fallen in the past 6 months No    Has the patient had a decrease in activity level because of a fear of falling?  No    Is the patient reluctant to leave their home because of a fear of falling?  No      Prior Function   Level of Independence Independent      Cognition   Overall Cognitive Status Within Functional Limits for tasks assessed      Observation/Other Assessments   Focus on Therapeutic Outcomes (FOTO)  33% limitation      AROM   Overall AROM Comments Patient exhibit lumbar motion grossly WFL and non-painful      Strength   Overall Strength Comments Core and hip strength grossly 4/5 MMT      Ambulation/Gait   Gait Pattern Within Functional Limits                         [facility] Adult PT Treatment/Exercise - 02/19/20 0001      Self-Care   Self-Care Other Self-Care Comments  Other Self-Care Comments  HEP finalization, discharge planning and follow-up instructions      Exercises   Exercises Lumbar;Knee/Hip   review HEP program     Lumbar Exercises: Stretches   Lower Trunk Rotation 5 reps;10 seconds    Lower Trunk Rotation Limitations figure-4    Other Lumbar Stretch Exercise Child's pose stretch lateral x 30 sec each      Lumbar Exercises: Supine   Dead Bug 10 reps   2 sets   Dead Bug Limitations 1 set with knees bent and 1 set with knee extension    Bridge with March 10 reps    Other Supine Lumbar Exercises 90-90 alternating heel taps x 10      Lumbar Exercises: Quadruped   Opposite Arm/Leg Raise 10 reps;3 seconds   2 sets                 PT Education - 02/19/20 1709    Education Details HEP finalization, discharge planning and follow-up instructions    Person(s) Educated Patient    Methods Explanation;Demonstration;Handout    Comprehension Verbalized understanding;Need further instruction;Returned demonstration            PT Short Term Goals - 02/05/20 1942      PT SHORT TERM GOAL #1   Title Pt will be Ind in an  initial HEP. Achieved    Status Achieved    Target Date 02/05/20      PT SHORT TERM GOAL #2   Title Review FOTO results. Achieved    Status Achieved    Target Date 02/05/20      PT SHORT TERM GOAL #3   Title Pt will voice understanding of measures to reduce and manage low back pain. Posture, sleeping positions, ther ex    Status Achieved    Target Date 02/05/20             PT Long Term Goals - 02/19/20 1719      PT LONG TERM GOAL #1   Title Pt will report greater ease in completing household activities to no difficulty    Baseline Patient reports no difficulty with household tasks    Status Achieved      PT LONG TERM GOAL #2   Title Pt' FOTO score will improve to 37% limitation    Baseline 33% limitation (previously 31% limitation)    Status Achieved      PT LONG TERM GOAL #3   Title Pt will demonstrate increase lumbar flexion mobility to reduce lumbar pain with prolonged positions    Baseline Patient exhibits lumbar AROM grossly WFL    Status Achieved      PT LONG TERM GOAL #4   Title Pt will report improved low back pain c daily activites with a decreased frequency and pain range 0-3/10    Baseline Patient reports back pain has no exceeded 1-2/10 in past few weeks    Status Achieved      PT LONG TERM GOAL #5   Title Pt will be Ind in a final HEP    Baseline Patient demonstrates independence    Status Achieved                 Plan - 02/19/20 1711    Clinical Impression Statement Patient has achieved all established goals and demonstrates independency with HEP. She feels she is ready for discharge and has greatly improved in regard to functional level and pain level. Patient will be discharged to independent HEP.  PT Next Visit Plan NA - DC    PT Home Exercise Plan [id number]    Consulted and Agree with Plan of Care Patient           Patient will benefit from skilled therapeutic intervention in order to improve the following deficits and impairments:  NA     Visit Diagnosis: Chronic midline low back pain, unspecified whether sciatica present  Muscle spasm of back  Other intervertebral disc degeneration, lumbar region     Problem List There are no problems to display for this patient.    PHYSICAL THERAPY DISCHARGE SUMMARY  Visits from Start of Care: 9  Current functional level related to goals / functional outcomes: See above   Remaining deficits: See above   Education / Equipment: HEP  Plan: Patient agrees to discharge.  Patient goals were met. Patient is being discharged due to meeting the stated rehab goals.  ?????     Hilda Blades, PT, DPT, LAT, ATC 02/19/20  5:58 PM Phone: [phone] Fax: [facility] [address] [place], [place], [address] Phone: [phone]   Fax:  [phone]  Name: Malka Bocek MRN: [id number] Date of Birth: [birth year]

## 2020-04-04 ENCOUNTER — Ambulatory Visit: Payer: Medicare Other | Attending: Family

## 2020-04-04 DIAGNOSIS — Z23 Encounter for immunization: Secondary | ICD-10-CM

## 2020-06-18 ENCOUNTER — Ambulatory Visit: Payer: Medicare Other | Admitting: Podiatry

## 2020-06-25 ENCOUNTER — Other Ambulatory Visit: Payer: Self-pay

## 2020-06-25 ENCOUNTER — Encounter: Payer: Self-pay | Admitting: Podiatry

## 2020-06-25 ENCOUNTER — Ambulatory Visit: Payer: Medicare Other | Admitting: Podiatry

## 2020-06-25 DIAGNOSIS — L309 Dermatitis, unspecified: Secondary | ICD-10-CM | POA: Diagnosis not present

## 2020-06-25 DIAGNOSIS — B351 Tinea unguium: Secondary | ICD-10-CM

## 2020-06-25 NOTE — Progress Notes (Signed)
Subjective:   Patient ID: Makayla Duran, female   DOB: 70 y.o.   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  Patient will be seen back as needed

## 2020-07-14 ENCOUNTER — Other Ambulatory Visit: Payer: Self-pay | Admitting: Family

## 2020-07-15 ENCOUNTER — Other Ambulatory Visit: Payer: Self-pay | Admitting: Family

## 2020-07-15 ENCOUNTER — Other Ambulatory Visit: Payer: Self-pay

## 2020-07-15 ENCOUNTER — Ambulatory Visit
Admission: RE | Admit: 2020-07-15 | Discharge: 2020-07-15 | Disposition: A | Discharge status: Home / Self Care | Payer: Medicare Other | Source: Ambulatory Visit | Attending: Internal Medicine | Admitting: Internal Medicine

## 2020-07-15 DIAGNOSIS — R928 Other abnormal and inconclusive findings on diagnostic imaging of breast: Secondary | ICD-10-CM

## 2020-08-08 NOTE — Progress Notes (Signed)
   Covid-19 Vaccination Clinic  Name:  Tonianne Fine    MRN: [id number] DOB: 13-Mar-1951  08/08/2020  Ms. Luper was observed post Covid-19 immunization for 15 minutes without incident. She was provided with Vaccine Information Sheet and instruction to access the V-Safe system.   Ms. Clingenpeel was instructed to call 911 with any severe reactions post vaccine: Marland Kitchen Difficulty breathing  . Swelling of face and throat  . A fast heartbeat  . A bad rash all over body  . Dizziness and weakness   Immunizations Administered    Name Date Dose VIS Date Route   Moderna Covid-19 Booster Vaccine 04/04/2020  3:30 PM 0.25 mL 02/06/2020 Intramuscular   Manufacturer: Moderna   Lot: [id number]   [place]: [id number]

## 2020-09-04 ENCOUNTER — Ambulatory Visit: Payer: Medicare Other | Attending: Family

## 2020-09-04 DIAGNOSIS — Z23 Encounter for immunization: Secondary | ICD-10-CM

## 2020-09-04 NOTE — Progress Notes (Signed)
   Covid-19 Vaccination Clinic  Name:  Tranise Forrest    MRN: [id number] DOB: 1951-03-09  09/04/2020  Ms. Earwood was observed post Covid-19 immunization for 15 minutes without incident. She was provided with Vaccine Information Sheet and instruction to access the V-Safe system.   Ms. Fayette was instructed to call 911 with any severe reactions post vaccine: Marland Kitchen Difficulty breathing  . Swelling of face and throat  . A fast heartbeat  . A bad rash all over body  . Dizziness and weakness   Immunizations Administered    Name Date Dose VIS Date Route   Moderna Covid-19 Booster Vaccine 09/04/2020  4:15 PM 0.25 mL 02/06/2020 Intramuscular   Manufacturer: Moderna   Lot: [id number]   [place]: [id number]

## 2020-11-19 ENCOUNTER — Other Ambulatory Visit (HOSPITAL_BASED_OUTPATIENT_CLINIC_OR_DEPARTMENT_OTHER): Payer: Self-pay | Admitting: Student

## 2020-11-19 DIAGNOSIS — M542 Cervicalgia: Secondary | ICD-10-CM

## 2021-09-03 ENCOUNTER — Other Ambulatory Visit: Payer: Self-pay | Admitting: Student

## 2021-09-03 DIAGNOSIS — N6320 Unspecified lump in the left breast, unspecified quadrant: Secondary | ICD-10-CM

## 2021-09-21 ENCOUNTER — Ambulatory Visit
Admission: RE | Admit: 2021-09-21 | Discharge: 2021-09-21 | Disposition: A | Discharge status: Home / Self Care | Payer: Medicare Other | Source: Ambulatory Visit | Attending: Student | Admitting: Student

## 2021-09-21 DIAGNOSIS — N6320 Unspecified lump in the left breast, unspecified quadrant: Secondary | ICD-10-CM

## 2022-03-07 IMAGING — MG MM DIGITAL DIAGNOSTIC UNILAT*L* W/ TOMO W/ CAD
8 series · 9 of 24 positions shown · non-contrast
Comparison: 07/05/2019

CLINICAL DATA: First six-month follow-up for probably benign mass
in the LEFT breast.

EXAM:
DIGITAL DIAGNOSTIC UNILATERAL LEFT MAMMOGRAM WITH TOMO AND CAD

[L MLO synth-2D (1 of 2)]
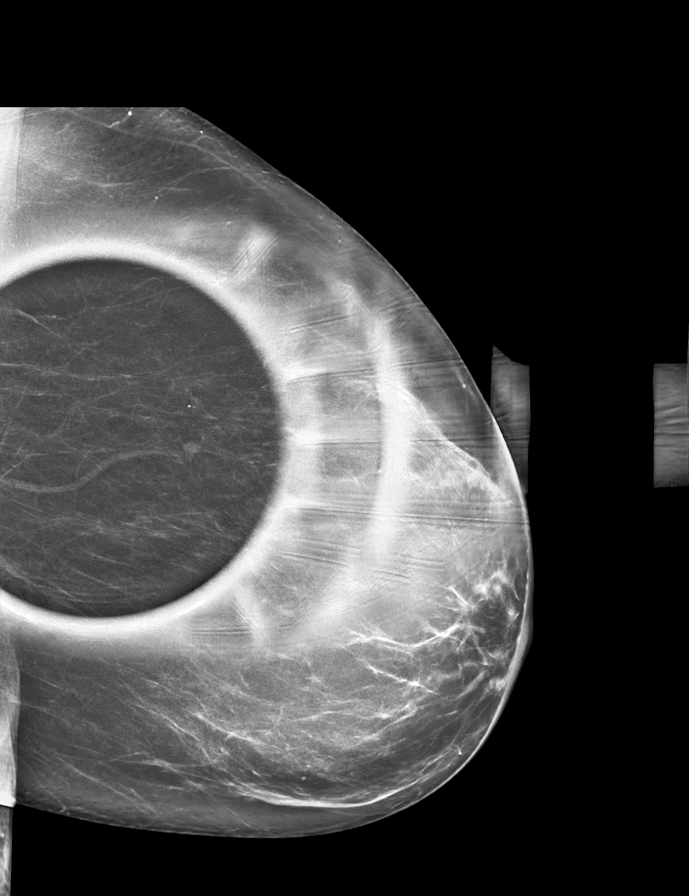

[L CC synth-2D (1 of 2)]
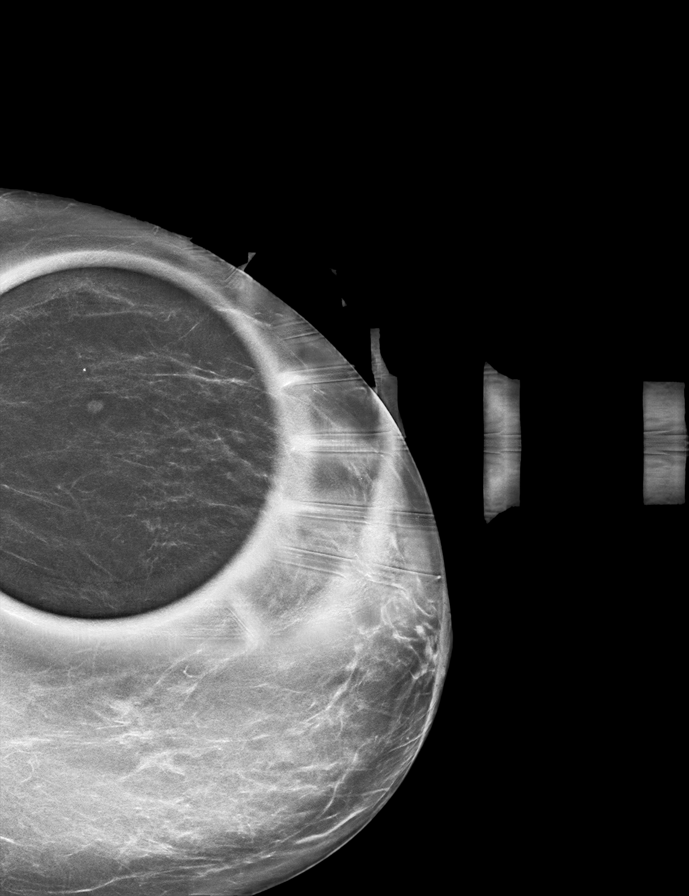

[L MLO synth-2D (2 of 2)]
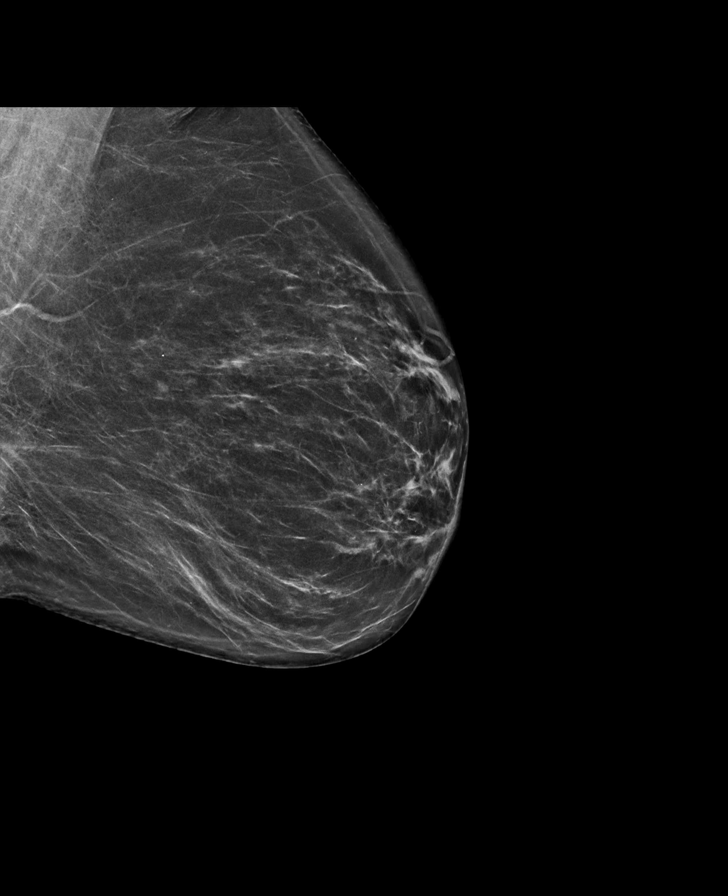

[L CC synth-2D (2 of 2)]
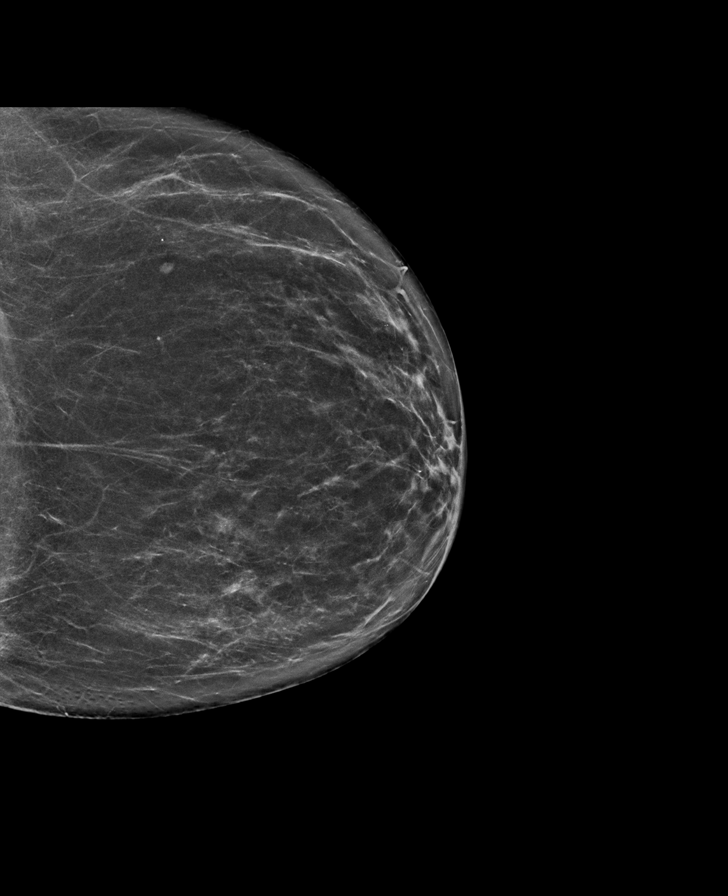

[L CC tomo · 2 of 61 frames shown (1 of 2)]
[frame 20/61]
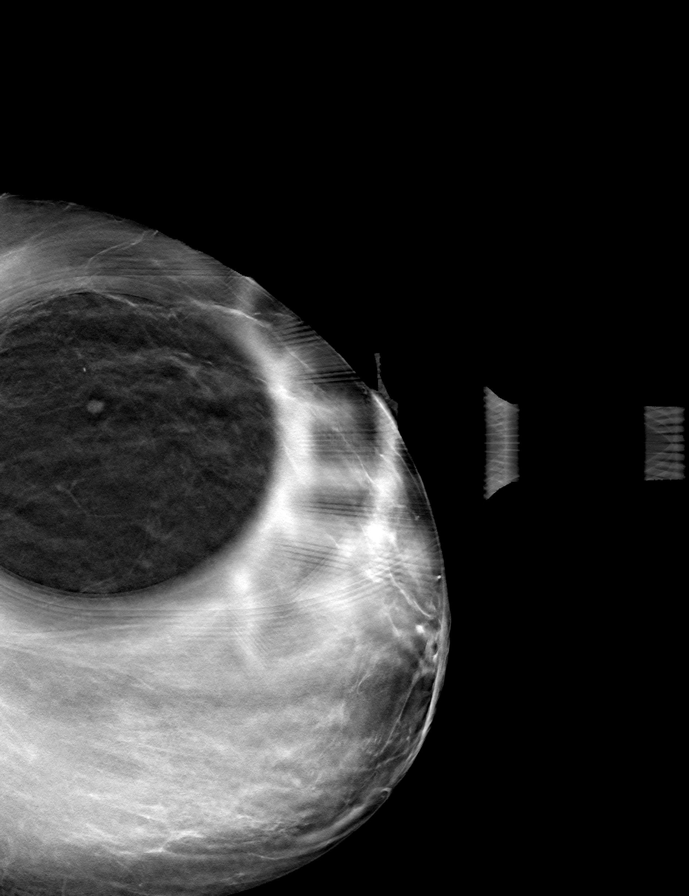
[frame 31/61]
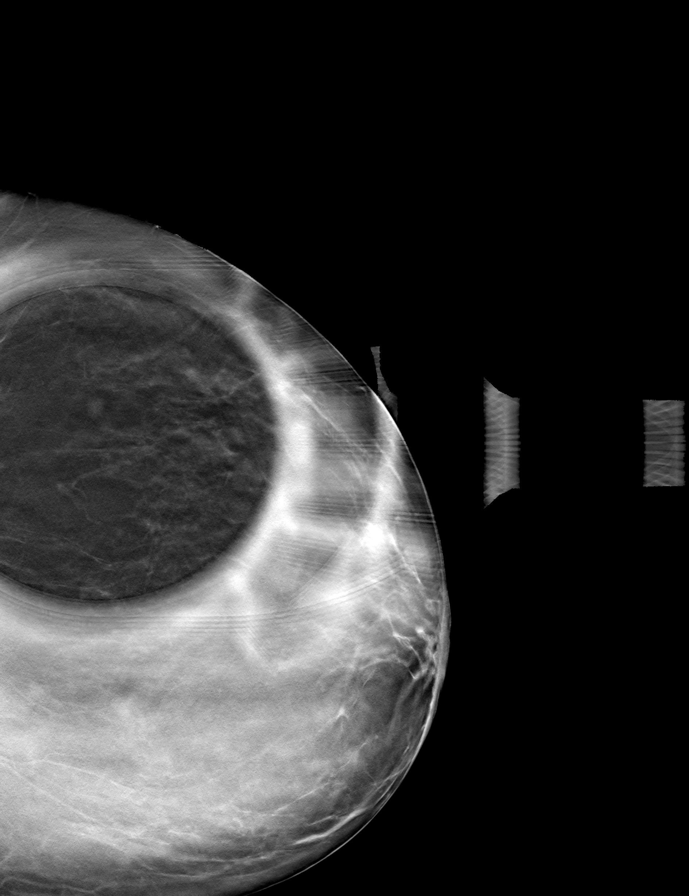

[L MLO tomo (1 of 2) · tomo slice 35/69.0]
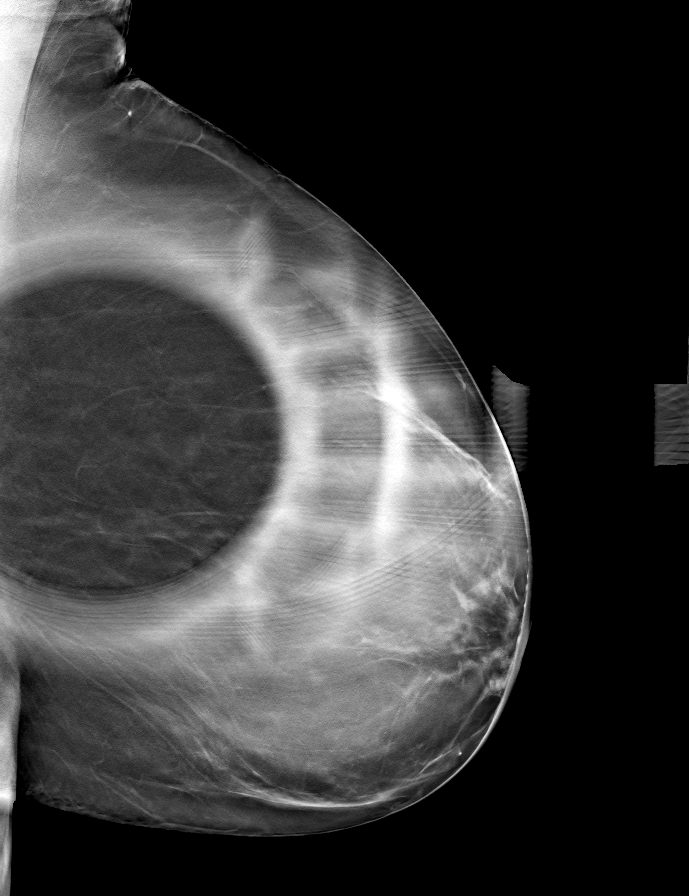

[L CC tomo (2 of 2) · tomo slice 34/67.0]
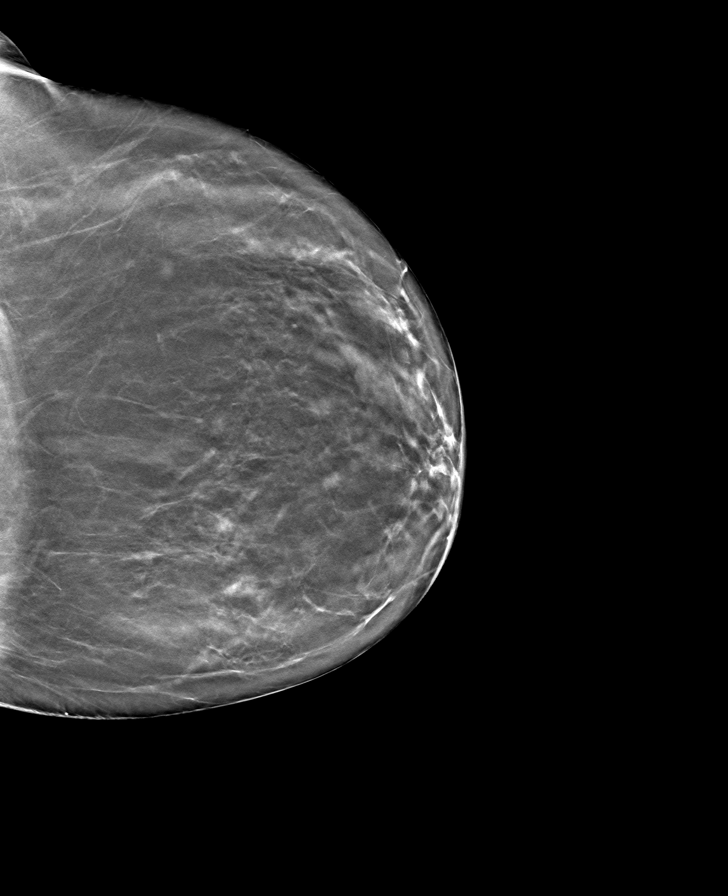

[L MLO tomo (2 of 2) · tomo slice 38/75.0]
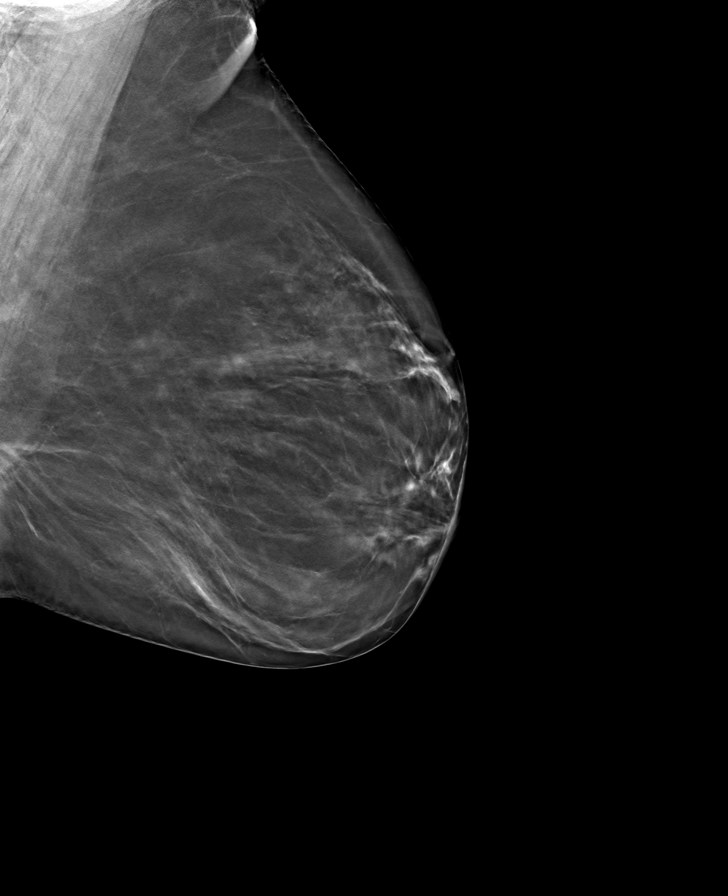

[9 of 24 positions shown; findings below may reference images not displayed]

ACR Breast Density Category b: There are scattered areas of
fibroglandular density.
FINDINGS: Within the UPPER-OUTER QUADRANT of the LEFT breast there is a
circumscribed oval mass, stable in appearance. No associated
microcalcifications or distortion. No new mass.

Mammographic images were processed with CAD.
IMPRESSION: Stable probably benign mass without previous sonographic correlate
in the LEFT breast.

RECOMMENDATION:
Recommend bilateral diagnostic mammogram in 6 months.

I have discussed the findings and recommendations with the patient.
If applicable, a reminder letter will be sent to the patient
regarding the next appointment.

BI-RADS CATEGORY  3: Probably benign.

## 2022-07-27 ENCOUNTER — Other Ambulatory Visit: Payer: Self-pay | Admitting: Family Medicine

## 2022-07-27 ENCOUNTER — Ambulatory Visit
Admission: RE | Admit: 2022-07-27 | Discharge: 2022-07-27 | Disposition: A | Discharge status: Home / Self Care | Payer: BC Managed Care – PPO | Source: Ambulatory Visit | Attending: Family Medicine | Admitting: Family Medicine

## 2022-07-27 DIAGNOSIS — S0990XA Unspecified injury of head, initial encounter: Secondary | ICD-10-CM

## 2022-08-04 ENCOUNTER — Other Ambulatory Visit: Payer: Self-pay | Admitting: Family Medicine

## 2022-08-04 DIAGNOSIS — Z9181 History of falling: Secondary | ICD-10-CM

## 2022-08-05 ENCOUNTER — Ambulatory Visit
Admission: RE | Admit: 2022-08-05 | Discharge: 2022-08-05 | Disposition: A | Discharge status: Home / Self Care | Payer: Medicare Other | Source: Ambulatory Visit | Attending: Family Medicine | Admitting: Family Medicine

## 2022-08-05 DIAGNOSIS — Z9181 History of falling: Secondary | ICD-10-CM

## 2024-05-04 ENCOUNTER — Ambulatory Visit (HOSPITAL_COMMUNITY)
Admission: EM | Admit: 2024-05-04 | Discharge: 2024-05-04 | Disposition: A | Discharge status: Home / Self Care | Attending: Family Medicine | Admitting: Family Medicine

## 2024-05-04 ENCOUNTER — Encounter (HOSPITAL_COMMUNITY): Payer: Self-pay | Admitting: Emergency Medicine

## 2024-05-04 DIAGNOSIS — J040 Acute laryngitis: Secondary | ICD-10-CM

## 2024-05-04 NOTE — ED Provider Notes (Signed)
 " [facility]    CSN: [id number] Arrival date & time: 05/04/24  1642      History   Chief Complaint Chief Complaint  Patient presents with   cold sweats   Laryngitis    HPI Makayla Duran is a 74 y.o. female.   HPI  Here for hoarseness.  About 2 weeks ago she came down with what she calls a head cold with cough and nasal congestion and myalgia.  She improved a lot and by January 12 she felt much better and had less cough and congestion.  She went back to work on January 13.  Then by January 14 she had developed hoarseness and was experiencing a lot of difficulty talking.  She is maybe having a little bit of a cold sweat here and there but denies any shortness of breath and is not really hurting now.  Her cough has reduced a lot and she has just a little bit of rhinorrhea.  No sinus pressure or headache.  Her throat is not sore  No fever in the last 2 to 3 days either.  A steroid injection previously has caused her some side effects.  Past Medical History:  Diagnosis Date   Anemia    Arthritis    neck   Blood transfusion without reported diagnosis    1990   Meniere disease 2018   Scoliosis of thoracolumbar spine    Syncope    Vertigo     There are no active problems to display for this patient.   Past Surgical History:  Procedure Laterality Date   ABDOMINAL HYSTERECTOMY     TONSILLECTOMY      OB History   No obstetric history on file.      Home Medications    Prior to Admission medications  Medication Sig Start Date End Date Taking? Authorizing Provider  rosuvastatin (CRESTOR) 5 MG tablet Take 5 mg by mouth daily. 05/04/24  Yes [provider]  aspirin EC 325 MG tablet Take 325 mg by mouth 3 (three) times daily. FOR CHEST AND DENTAL-RELATED PAIN Patient not taking: Reported on 01/24/2020    [provider]  ferrous gluconate (FERGON) 324 MG tablet Take 324 mg by mouth daily with breakfast. For anemia. Patient states  that she takes up to 6 tablets by mouth three times a day Patient not taking: Reported on 01/24/2020    [provider]  ferrous sulfate 325 (65 FE) MG tablet Take 325 mg by mouth daily with breakfast. Patient not taking: Reported on 01/24/2020    [provider]  meloxicam (MOBIC) 15 MG tablet Take 15 mg by mouth daily. 12/31/19   [provider]    Family History Family History  Problem Relation Age of Onset   Heart failure Mother    Cancer Mother    Heart failure Father    Esophageal cancer Maternal Grandmother    Colon polyps Neg Hx    Stomach cancer Neg Hx    Rectal cancer Neg Hx     Social History Social History[1]   Allergies   Other and Other   Review of Systems Review of Systems   Physical Exam Triage Vital Signs ED Triage Vitals  Encounter Vitals Group     BP 05/04/24 1720 (!) 157/68     Girls Systolic BP Percentile --      Girls Diastolic BP Percentile --      Boys Systolic BP Percentile --      Boys Diastolic  BP Percentile --      Pulse Rate 05/04/24 1720 (!) 56     Resp 05/04/24 1720 16     Temp 05/04/24 1720 97.9 F (36.6 C)     Temp Source 05/04/24 1720 Oral     SpO2 05/04/24 1720 94 %     Weight --      Height --      Head Circumference --      Peak Flow --      Pain Score 05/04/24 1718 4     Pain Loc --      Pain Education --      Exclude from Growth Chart --    No data found.  Updated Vital Signs BP (!) 157/68 (BP Location: Right Arm)   Pulse (!) 56   Temp 97.9 F (36.6 C) (Oral)   Resp 16   SpO2 94%   Visual Acuity Right Eye Distance:   Left Eye Distance:   Bilateral Distance:    Right Eye Near:   Left Eye Near:    Bilateral Near:     Physical Exam Vitals reviewed.  Constitutional:      General: She is not in acute distress.    Appearance: She is not ill-appearing, toxic-appearing or diaphoretic.  HENT:     Nose: Nose normal.     Mouth/Throat:     Mouth: Mucous membranes are moist.      Comments: There is a little bit of clear mucus in the posterior oropharynx Eyes:     Extraocular Movements: Extraocular movements intact.     Conjunctiva/sclera: Conjunctivae normal.     Pupils: Pupils are equal, round, and reactive to light.  Cardiovascular:     Rate and Rhythm: Normal rate and regular rhythm.     Heart sounds: No murmur heard. Pulmonary:     Effort: No respiratory distress.     Breath sounds: No stridor. No wheezing, rhonchi or rales.  Musculoskeletal:     Cervical back: Neck supple.  Lymphadenopathy:     Cervical: No cervical adenopathy.  Skin:    Capillary Refill: Capillary refill takes less than 2 seconds.     Coloration: Skin is not jaundiced or pale.  Neurological:     General: No focal deficit present.     Mental Status: She is alert and oriented to person, place, and time.  Psychiatric:        Behavior: Behavior normal.      UC Treatments / Results  Labs (all labs ordered are listed, but only abnormal results are displayed) Labs Reviewed - No data to display  EKG   Radiology No results found.  Procedures Procedures (including critical care time)  Medications Ordered in UC Medications - No data to display  Initial Impression / Assessment and Plan / UC Course  I have reviewed the triage vital signs and the nursing notes.  Pertinent labs & imaging results that were available during my care of the patient were reviewed by me and considered in my medical decision making (see chart for details).     I had thought about getting her a steroid injection, but with her history of side effects we decided against it.  I discussed with her conservative measures to manage laryngitis, mainly voice rest.  We are entering the weekend and she will be able to rest her voice for the next 3 days with the 19th being a holiday.  Work note is provided  She can continue the DayQuil/NyQuil as  needed  She will humidify her air Final Clinical Impressions(s) / UC  Diagnoses   Final diagnoses:  Laryngitis     Discharge Instructions      Rest your voice. Drink plenty of fluids Using a humidifier in your house could help If you have any congestion you can continue using DayQuil/NyQuil        ED Prescriptions   None    PDMP not reviewed this encounter.    [1]  Social History Tobacco Use   Smoking status: Every Day   Smokeless tobacco: Current  Substance Use Topics   Alcohol use: Yes    Alcohol/week: 1.0 standard drink of alcohol    Types: 1 Glasses of wine per week    Comment: nightly    Drug use: Not Currently    Types: Cocaine     Vonna Sharlet POUR, MD 05/04/24 1754  "

## 2024-05-04 NOTE — Discharge Instructions (Signed)
 Rest your voice. Drink plenty of fluids Using a humidifier in your house could help If you have any congestion you can continue using DayQuil/NyQuil

## 2024-05-04 NOTE — ED Triage Notes (Signed)
 Patient has been sick for the last 2 weeks with cough and nasal congestion that has gotten better.  Patient went back to work on Tuesday and left early on Wednesday because she started losing her voice.  Patient woke up on Wednesday and her voice was gone.  Patient is body is aching... Patient has been taken Day and Night Quil. Patient denies any fever but has been having cold sweats.
